# Patient Record
Sex: Male | Born: 1950 | Race: White | Hispanic: No | Marital: Married | Smoking: Never smoker
Health system: Southern US, Community
[De-identification: ages and names within clinical notes are randomized; demographics above are authoritative.]

## PROBLEM LIST (undated history)

## (undated) DIAGNOSIS — E785 Hyperlipidemia, unspecified: Secondary | ICD-10-CM

## (undated) HISTORY — PX: INGUINAL HERNIA REPAIR: SUR1180

---

## 2006-04-26 ENCOUNTER — Ambulatory Visit (HOSPITAL_COMMUNITY): Admission: RE | Admit: 2006-04-26 | Discharge: 2006-04-26 | Payer: Self-pay | Admitting: Internal Medicine

## 2006-04-26 ENCOUNTER — Encounter (INDEPENDENT_AMBULATORY_CARE_PROVIDER_SITE_OTHER): Payer: Self-pay | Admitting: Specialist

## 2006-04-26 ENCOUNTER — Ambulatory Visit: Payer: Self-pay | Admitting: Internal Medicine

## 2006-04-26 HISTORY — PX: COLONOSCOPY: SHX174

## 2009-04-13 ENCOUNTER — Ambulatory Visit (HOSPITAL_BASED_OUTPATIENT_CLINIC_OR_DEPARTMENT_OTHER): Admission: RE | Admit: 2009-04-13 | Discharge: 2009-04-13 | Payer: Self-pay | Admitting: Urology

## 2009-12-26 ENCOUNTER — Emergency Department (HOSPITAL_COMMUNITY): Admission: EM | Admit: 2009-12-26 | Discharge: 2009-12-26 | Payer: Self-pay | Admitting: Emergency Medicine

## 2010-07-19 LAB — POCT HEMOGLOBIN-HEMACUE: Hemoglobin: 15.5 g/dL (ref 13.0–17.0)

## 2010-09-03 NOTE — Op Note (Signed)
[name], [name]               ACCOUNT NO.:  [id number]   MEDICAL RECORD NO.:  [id number]          PATIENT TYPE:  AMB   LOCATION:  DAY                           FACILITY:  [facility]   PHYSICIAN:  R. Roetta Sessions, M.D. DATE OF BIRTH:  16-Jul-1950   DATE OF PROCEDURE:  04/26/2006  DATE OF DISCHARGE:                               OPERATIVE REPORT   Colonoscopy with biopsy.   INDICATIONS FOR PROCEDURE:  The patient is a 60 year old gentleman with  positive family history of colon cancer in his father and brother with  colon polyps.  Last colonoscopy was some 8 years ago.  He has no lower  GI tract symptoms.  Colonoscopy is now being done as a screening  maneuver.  This approach has discussed with the patient at length.  Potential risks, benefits and alternatives have been reviewed, questions  answered.  Please see documentation in the medical record.   PROCEDURE NOTE:  O2 saturation, blood pressure, pulse and respirations  were monitored throughout the entire procedure.  Conscious sedation with  Demerol 75 mg IV, Versed 4 mg IV.   INSTRUMENT:  Pentax video chip system.   FINDINGS:  Digital rectal exam revealed no abnormalities.   ENDOSCOPIC FINDINGS:  Prep was fair.   Colon:  Colonic mucosa was surveyed from the rectosigmoid junction  through the left, transverse and right colon to the appendiceal orifice,  ileocecal valve and cecum.  These structures were well seen and  photographed for the record.  From this level, scope was withdrawn.  All  previously mentioned mucosal surfaces were again seen.  The patient had  scattered left-sided diverticula.  The remainder of the colonic mucosa  appeared normal.  Scope was pulled down into the rectum where a thorough  examination of the rectal mucosa including retroflexed view of the anal  verge was undertaken.  The patient had 2 diminutive polyps just inside  the anal verge that were cold biopsied/removed.  The patient also had  some anal  papilla.  Otherwise, rectal mucosa appeared normal.  The  patient tolerated the procedure well and was reactive to endoscopy.   IMPRESSION:  Anal papilla and 2 diminutive rectal polyps status post  cold biopsy removal.  Otherwise normal rectum.  Left-sided diverticula.  The remainder of the colonic mucosa appeared normal.   RECOMMENDATIONS:  1. Diverticulosis literature provided to [name].  2. Follow-up on path.  3. No aspirin for 10 days.  4. Further recommendations to follow.      Chris Ramirez, M.D.  Electronically Signed     RMR/MEDQ  D:  04/26/2006  T:  04/26/2006  Job:  [id number]   cc:   R. Roetta Sessions, M.D.  [address]  [place]  [place] [address]

## 2011-04-05 ENCOUNTER — Encounter: Payer: Self-pay | Admitting: Internal Medicine

## 2011-04-14 ENCOUNTER — Encounter: Payer: Self-pay | Admitting: Urgent Care

## 2011-04-21 ENCOUNTER — Ambulatory Visit: Payer: Self-pay | Admitting: Urgent Care

## 2011-04-28 ENCOUNTER — Ambulatory Visit: Payer: Self-pay | Admitting: Urgent Care

## 2011-06-16 ENCOUNTER — Encounter: Payer: Self-pay | Admitting: Internal Medicine

## 2011-06-17 ENCOUNTER — Ambulatory Visit: Payer: Self-pay | Admitting: Gastroenterology

## 2012-05-29 ENCOUNTER — Ambulatory Visit: Payer: Self-pay | Admitting: Urgent Care

## 2012-06-25 ENCOUNTER — Ambulatory Visit (INDEPENDENT_AMBULATORY_CARE_PROVIDER_SITE_OTHER): Payer: 59 | Admitting: Gastroenterology

## 2012-06-25 ENCOUNTER — Encounter: Payer: Self-pay | Admitting: Gastroenterology

## 2012-06-25 ENCOUNTER — Other Ambulatory Visit: Payer: Self-pay | Admitting: Internal Medicine

## 2012-06-25 VITALS — BP 136/83 | HR 85 | Temp 98.2°F | Ht 70.0 in | Wt 222.0 lb

## 2012-06-25 DIAGNOSIS — Z800 Family history of malignant neoplasm of digestive organs: Secondary | ICD-10-CM | POA: Insufficient documentation

## 2012-06-25 MED ORDER — PEG 3350-KCL-NA BICARB-NACL 420 G PO SOLR: 4000.0000 mL | ORAL | Status: DC

## 2012-06-25 NOTE — Progress Notes (Signed)
Faxed to PCP

## 2012-06-25 NOTE — Patient Instructions (Addendum)
You are scheduled for a colonoscopy with Dr. Jena Gauss in the near future.  Have a great week!

## 2012-06-25 NOTE — Progress Notes (Signed)
Primary Care Physician:  Chris Obey, MD Primary Gastroenterologist:  Dr. Jena Gauss   Chief Complaint  Patient presents with  . Follow-up    HPI:   62 year old pleasant male presents today for an updated colonoscopy. He has a family history of colon cancer in his father, diagnosed in his 25s. Chris Ramirez last colonoscopy was in 2008 with hyperplastic polyps noted. States eats healthy during the week and unhealthy on the weekends. Mondays are usually a "clean out day", with several bowel movements. No rectal bleeding. Rare reflux related to green peppers. Notes once a month having the sensation of food "not wanting to go down". Points to throat. Thinks he is "diving in" too fast. Doesn't feel like this is an issue, not interested in pursuing further. Notes as rare, doesn't consider a problem.    Past Medical History  Diagnosis Date  . Hypercholesterolemia     resolved with weight loss    Past Surgical History  Procedure Laterality Date  . Colonoscopy  04/26/06    anal paplla and 2 diminutive rectal polyps(Hyperplastic) other wise normal  . Inguinal hernia repair      No current outpatient prescriptions on file.   No current facility-administered medications for this visit.    Allergies as of 06/25/2012  . (No Known Allergies)    Family History  Problem Relation Age of Onset  . Colon cancer Father     in his 31s    History   Social History  . Marital Status: Married    Spouse Name: N/A    Number of Children: N/A  . Years of Education: N/A   Occupational History  . Not on file.   Social History Main Topics  . Smoking status: Never Smoker   . Smokeless tobacco: Not on file  . Alcohol Use: No  . Drug Use: No  . Sexually Active: Not on file   Other Topics Concern  . Not on file   Social History Narrative  . No narrative on file    Review of Systems: Gen: Denies any fever, chills, fatigue, weight loss, lack of appetite.  CV: Denies chest pain, heart  palpitations, peripheral edema, syncope.  Resp: Denies shortness of breath at rest or with exertion. Denies wheezing or cough.  GI: Denies dysphagia or odynophagia. Denies jaundice, hematemesis, fecal incontinence. GU : Denies urinary burning, urinary frequency, urinary hesitancy MS: Denies joint pain, muscle weakness, cramps, or limitation of movement.  Derm: Denies rash, itching, dry skin Psych: Denies depression, anxiety, memory loss, and confusion Heme: Denies bruising, bleeding, and enlarged lymph nodes.  Physical Exam: BP 136/83  Pulse 85  Temp(Src) 98.2 F (36.8 C) (Oral)  Ht 5\' 10"  (1.778 m)  Wt 222 lb (100.699 kg)  BMI 31.85 kg/m2 General:   Alert and oriented. Pleasant and cooperative. Well-nourished and well-developed.  Head:  Normocephalic and atraumatic. Eyes:  Without icterus, sclera clear and conjunctiva pink.  Ears:  Normal auditory acuity. Nose:  No deformity, discharge,  or lesions. Mouth:  No deformity or lesions, oral mucosa pink.  Neck:  Supple, without mass or thyromegaly. Lungs:  Clear to auscultation bilaterally. No wheezes, rales, or rhonchi. No distress.  Heart:  S1, S2 present without murmurs appreciated.  Abdomen:  +BS, soft, non-tender and non-distended. No HSM noted. No guarding or rebound. No masses appreciated.  Rectal:  Deferred  Msk:  Symmetrical without gross deformities. Normal posture. Extremities:  Without clubbing or edema. Neurologic:  Alert and  oriented x4;  grossly normal  neurologically. Skin:  Intact without significant lesions or rashes. Cervical Nodes:  No significant cervical adenopathy. Psych:  Alert and cooperative. Normal mood and affect.

## 2012-06-25 NOTE — Assessment & Plan Note (Signed)
62 year old male with need for updated colonoscopy. Last lower GI evaluation in 2008 with hyperplastic polyps by Dr. Jena Gauss. He has a family history of colon cancer, with his father diagnosed in his 25s. No lower GI symptoms currently. He does note rare incidences of oropharyngeal dysphagia, and he attributes this to eating too quickly or too large of a bite. He is not interested in pursuing this further, as he feels it is more behavior-related. No other concerning symptoms at this time.   Proceed with TCS with Dr. Jena Gauss in near future: the risks, benefits, and alternatives have been discussed with the patient in detail. The patient states understanding and desires to proceed.

## 2012-07-05 ENCOUNTER — Encounter (HOSPITAL_COMMUNITY): Payer: Self-pay | Admitting: Pharmacy Technician

## 2012-07-13 MED ORDER — SODIUM CHLORIDE 0.45 % IV SOLN: INTRAVENOUS | Status: DC

## 2012-07-16 ENCOUNTER — Encounter (HOSPITAL_COMMUNITY): Payer: Self-pay | Admitting: *Deleted

## 2012-07-16 ENCOUNTER — Ambulatory Visit (HOSPITAL_COMMUNITY)
Admission: RE | Admit: 2012-07-16 | Discharge: 2012-07-16 | Disposition: A | Discharge status: Home / Self Care | Payer: 59 | Source: Ambulatory Visit | Attending: Internal Medicine | Admitting: Internal Medicine

## 2012-07-16 ENCOUNTER — Encounter (HOSPITAL_COMMUNITY)
Admission: RE | Disposition: A | Discharge status: Home / Self Care | Payer: Self-pay | Source: Ambulatory Visit | Attending: Internal Medicine

## 2012-07-16 DIAGNOSIS — K5730 Diverticulosis of large intestine without perforation or abscess without bleeding: Secondary | ICD-10-CM | POA: Insufficient documentation

## 2012-07-16 DIAGNOSIS — Z1211 Encounter for screening for malignant neoplasm of colon: Secondary | ICD-10-CM | POA: Insufficient documentation

## 2012-07-16 DIAGNOSIS — Z800 Family history of malignant neoplasm of digestive organs: Secondary | ICD-10-CM | POA: Insufficient documentation

## 2012-07-16 HISTORY — PX: COLONOSCOPY: SHX5424

## 2012-07-16 SURGERY — COLONOSCOPY
Anesthesia: Moderate Sedation

## 2012-07-16 MED ORDER — MEPERIDINE HCL 100 MG/ML IJ SOLN
INTRAMUSCULAR | Status: AC
  Filled 2012-07-16: qty 1

## 2012-07-16 MED ORDER — SODIUM CHLORIDE 0.9 % IV SOLN
INTRAVENOUS | Status: DC
  Administered 2012-07-16: 10:00:00 via INTRAVENOUS

## 2012-07-16 MED ORDER — MEPERIDINE HCL 100 MG/ML IJ SOLN
INTRAMUSCULAR | Status: DC | PRN
  Administered 2012-07-16: 50 mg via INTRAVENOUS
  Administered 2012-07-16: 25 mg via INTRAVENOUS

## 2012-07-16 MED ORDER — STERILE WATER FOR IRRIGATION IR SOLN
Status: DC | PRN
  Administered 2012-07-16: 10:00:00

## 2012-07-16 MED ORDER — MIDAZOLAM HCL 5 MG/5ML IJ SOLN
INTRAMUSCULAR | Status: AC
  Filled 2012-07-16: qty 10

## 2012-07-16 MED ORDER — ONDANSETRON HCL 4 MG/2ML IJ SOLN
INTRAMUSCULAR | Status: DC | PRN
  Administered 2012-07-16: 4 mg via INTRAVENOUS

## 2012-07-16 MED ORDER — ONDANSETRON HCL 4 MG/2ML IJ SOLN
INTRAMUSCULAR | Status: AC
  Filled 2012-07-16: qty 2

## 2012-07-16 MED ORDER — MIDAZOLAM HCL 5 MG/5ML IJ SOLN
INTRAMUSCULAR | Status: DC | PRN
  Administered 2012-07-16 (×2): 2 mg via INTRAVENOUS
  Administered 2012-07-16: 1 mg via INTRAVENOUS

## 2012-07-16 NOTE — Op Note (Signed)
[facility] [address] [place], [address]   COLONOSCOPY PROCEDURE REPORT  PATIENT: Chris Ramirez, Chris Ramirez  MR#:         [id number] BIRTHDATE: 12/22/1950 , 62  yrs. old GENDER: Male ENDOSCOPIST: R.  Roetta Sessions, MD FACP FACG REFERRED BY:  Gareth Morgan, M.D. PROCEDURE DATE:  07/16/2012 PROCEDURE:     high-risk screening ileocolonoscopy  INDICATIONS: Positive family history of colon cancer in first-degree relative  INFORMED CONSENT:  The risks, benefits, alternatives and imponderables including but not limited to bleeding, perforation as well as the possibility of a missed lesion have been reviewed.  The potential for biopsy, lesion removal, etc. have also been discussed.  Questions have been answered.  All parties agreeable. Please see the history and physical in the medical record for more information.  MEDICATIONS: Versed 5 mg IV and Demerol 75 mg IV in divided doses. Zofran 4 mg IV  DESCRIPTION OF PROCEDURE:  After a digital rectal exam was performed, the EC-3890Li ([id number])  colonoscope was advanced from the anus through the rectum and colon to the area of the cecum, ileocecal valve and appendiceal orifice.  The cecum was deeply intubated.  These structures were well-seen and photographed for the record.  From the level of the cecum and ileocecal valve, the scope was slowly and cautiously withdrawn.  The mucosal surfaces were carefully surveyed utilizing scope tip deflection to facilitate fold flattening as needed.  The scope was pulled down into the rectum where a thorough examination including retroflexion was performed.    FINDINGS:  Adequate preparation. Normal rectum. Scattered left-sided diverticula; the remainder of the colonic mucosa appeared normal. The distal 10 cm of terminal ileal mucosa appeared normal.  THERAPEUTIC / DIAGNOSTIC MANEUVERS PERFORMED:  None  COMPLICATIONS: None  CECAL WITHDRAWAL TIME:  18 minutes  IMPRESSION:  Colonic  diverticulosis  RECOMMENDATIONS: Repeat screening colonoscopy in 5 years.   _______________________________ eSigned:  R. Roetta Sessions, MD FACP [facility] 07/16/2012 11:09 AM   CC:    PATIENT NAME:  Chris Ramirez, Chris Ramirez MR#: [id number]

## 2012-07-16 NOTE — H&P (View-Only) (Signed)
Primary Care Physician:  KNOWLTON,STEPHEN D, MD Primary Gastroenterologist:  Dr. Rourk   Chief Complaint  Patient presents with  . Follow-up    HPI:   62-year-old pleasant male presents today for an updated colonoscopy. He has a family history of colon cancer in his father, diagnosed in his 70s. Mr. Morlock's last colonoscopy was in 2008 with hyperplastic polyps noted. States eats healthy during the week and unhealthy on the weekends. Mondays are usually a "clean out day", with several bowel movements. No rectal bleeding. Rare reflux related to green peppers. Notes once a month having the sensation of food "not wanting to go down". Points to throat. Thinks he is "diving in" too fast. Doesn't feel like this is an issue, not interested in pursuing further. Notes as rare, doesn't consider a problem.    Past Medical History  Diagnosis Date  . Hypercholesterolemia     resolved with weight loss    Past Surgical History  Procedure Laterality Date  . Colonoscopy  04/26/06    anal paplla and 2 diminutive rectal polyps(Hyperplastic) other wise normal  . Inguinal hernia repair      No current outpatient prescriptions on file.   No current facility-administered medications for this visit.    Allergies as of 06/25/2012  . (No Known Allergies)    Family History  Problem Relation Age of Onset  . Colon cancer Father     in his 70s    History   Social History  . Marital Status: Married    Spouse Name: N/A    Number of Children: N/A  . Years of Education: N/A   Occupational History  . Not on file.   Social History Main Topics  . Smoking status: Never Smoker   . Smokeless tobacco: Not on file  . Alcohol Use: No  . Drug Use: No  . Sexually Active: Not on file   Other Topics Concern  . Not on file   Social History Narrative  . No narrative on file    Review of Systems: Gen: Denies any fever, chills, fatigue, weight loss, lack of appetite.  CV: Denies chest pain, heart  palpitations, peripheral edema, syncope.  Resp: Denies shortness of breath at rest or with exertion. Denies wheezing or cough.  GI: Denies dysphagia or odynophagia. Denies jaundice, hematemesis, fecal incontinence. GU : Denies urinary burning, urinary frequency, urinary hesitancy MS: Denies joint pain, muscle weakness, cramps, or limitation of movement.  Derm: Denies rash, itching, dry skin Psych: Denies depression, anxiety, memory loss, and confusion Heme: Denies bruising, bleeding, and enlarged lymph nodes.  Physical Exam: BP 136/83  Pulse 85  Temp(Src) 98.2 F (36.8 C) (Oral)  Ht 5' 10" (1.778 m)  Wt 222 lb (100.699 kg)  BMI 31.85 kg/m2 General:   Alert and oriented. Pleasant and cooperative. Well-nourished and well-developed.  Head:  Normocephalic and atraumatic. Eyes:  Without icterus, sclera clear and conjunctiva pink.  Ears:  Normal auditory acuity. Nose:  No deformity, discharge,  or lesions. Mouth:  No deformity or lesions, oral mucosa pink.  Neck:  Supple, without mass or thyromegaly. Lungs:  Clear to auscultation bilaterally. No wheezes, rales, or rhonchi. No distress.  Heart:  S1, S2 present without murmurs appreciated.  Abdomen:  +BS, soft, non-tender and non-distended. No HSM noted. No guarding or rebound. No masses appreciated.  Rectal:  Deferred  Msk:  Symmetrical without gross deformities. Normal posture. Extremities:  Without clubbing or edema. Neurologic:  Alert and  oriented x4;  grossly normal   neurologically. Skin:  Intact without significant lesions or rashes. Cervical Nodes:  No significant cervical adenopathy. Psych:  Alert and cooperative. Normal mood and affect.    

## 2012-07-16 NOTE — Interval H&P Note (Signed)
History and Physical Interval Note:  07/16/2012 10:22 AM  Chris Ramirez  has presented today for surgery, with the diagnosis of SCREENING COLONOSCOPY  The various methods of treatment have been discussed with the patient and family. After consideration of risks, benefits and other options for treatment, the patient has consented to  Procedure(s) with comments: COLONOSCOPY (N/A) - 8:30-moved to 10:30 Chris Ramirez to notify pt as a surgical intervention .  The patient's history has been reviewed, patient examined, no change in status, stable for surgery.  I have reviewed the patient's chart and labs.  Questions were answered to the patient's satisfaction.     Eula Listen  High-risk screening colonoscopy per plan.The risks, benefits, limitations, alternatives and imponderables have been reviewed with the patient. Questions have been answered. All parties are agreeable.

## 2012-07-18 ENCOUNTER — Encounter (HOSPITAL_COMMUNITY): Payer: Self-pay | Admitting: Internal Medicine

## 2012-09-16 ENCOUNTER — Emergency Department (HOSPITAL_COMMUNITY): Payer: 59

## 2012-09-16 ENCOUNTER — Observation Stay (HOSPITAL_COMMUNITY)
Admission: EM | Admit: 2012-09-16 | Discharge: 2012-09-17 | Disposition: A | Discharge status: Home / Self Care | Payer: 59 | Attending: Family Medicine | Admitting: Family Medicine

## 2012-09-16 ENCOUNTER — Encounter (HOSPITAL_COMMUNITY): Payer: Self-pay | Admitting: Emergency Medicine

## 2012-09-16 DIAGNOSIS — R55 Syncope and collapse: Principal | ICD-10-CM | POA: Insufficient documentation

## 2012-09-16 HISTORY — DX: Hyperlipidemia, unspecified: E78.5

## 2012-09-16 LAB — COMPREHENSIVE METABOLIC PANEL
ALT: 20 U/L (ref 0–53)
AST: 21 U/L (ref 0–37)
CO2: 26 mEq/L (ref 19–32)
Chloride: 106 mEq/L (ref 96–112)
Creatinine, Ser: 1.09 mg/dL (ref 0.50–1.35)
GFR calc non Af Amer: 71 mL/min — ABNORMAL LOW (ref >90)
Total Bilirubin: 0.7 mg/dL (ref 0.3–1.2)

## 2012-09-16 LAB — CBC WITH DIFFERENTIAL/PLATELET
Basophils Absolute: 0 10*3/uL (ref 0.0–0.1)
HCT: 45.3 % (ref 39.0–52.0)
Hemoglobin: 15.9 g/dL (ref 13.0–17.0)
Lymphocytes Relative: 36 % (ref 12–46)
Monocytes Absolute: 0.4 10*3/uL (ref 0.1–1.0)
Neutro Abs: 2.5 10*3/uL (ref 1.7–7.7)
RBC: 5.15 MIL/uL (ref 4.22–5.81)
RDW: 12.6 % (ref 11.5–15.5)
WBC: 4.7 10*3/uL (ref 4.0–10.5)

## 2012-09-16 LAB — URINALYSIS, ROUTINE W REFLEX MICROSCOPIC
Glucose, UA: NEGATIVE mg/dL
Hgb urine dipstick: NEGATIVE
Ketones, ur: NEGATIVE mg/dL
Protein, ur: NEGATIVE mg/dL

## 2012-09-16 LAB — GLUCOSE, CAPILLARY
Glucose-Capillary: 117 mg/dL — ABNORMAL HIGH (ref 70–99)
Glucose-Capillary: 126 mg/dL — ABNORMAL HIGH (ref 70–99)

## 2012-09-16 MED ORDER — ENOXAPARIN SODIUM 40 MG/0.4ML ~~LOC~~ SOLN: 40.0000 mg | SUBCUTANEOUS | Status: DC

## 2012-09-16 MED ORDER — ACETAMINOPHEN 325 MG PO TABS: 650.0000 mg | ORAL_TABLET | Freq: Four times a day (QID) | ORAL | Status: DC | PRN

## 2012-09-16 MED ORDER — ONDANSETRON HCL 4 MG PO TABS: 4.0000 mg | ORAL_TABLET | Freq: Four times a day (QID) | ORAL | Status: DC | PRN

## 2012-09-16 MED ORDER — ONDANSETRON HCL 4 MG/2ML IJ SOLN: 4.0000 mg | Freq: Four times a day (QID) | INTRAMUSCULAR | Status: DC | PRN

## 2012-09-16 MED ORDER — ACETAMINOPHEN 650 MG RE SUPP: 650.0000 mg | Freq: Four times a day (QID) | RECTAL | Status: DC | PRN

## 2012-09-16 MED ORDER — POTASSIUM CHLORIDE IN NACL 20-0.9 MEQ/L-% IV SOLN
INTRAVENOUS | Status: DC
  Administered 2012-09-16 – 2012-09-17 (×2): via INTRAVENOUS

## 2012-09-16 MED ORDER — SODIUM CHLORIDE 0.9 % IJ SOLN
3.0000 mL | Freq: Two times a day (BID) | INTRAMUSCULAR | Status: DC
  Administered 2012-09-17: 3 mL via INTRAVENOUS

## 2012-09-16 NOTE — Progress Notes (Signed)
[facility] History and Physical  Chris Ramirez ZOX:[id number] DOB: 1950-05-26 DOA: 09/16/2012  Referring physician: Dr. Clarene Duke PCP: Milana Obey, MD  Specialists:   Chief Complaint: Syncope  HPI: Chris Ramirez is a 62 y.o. male no significant medical problems who presents to the emergency room after a syncopal episode. Patient was at a church function today and had been standing for a long period of time. He reports feeling dizzy, lightheaded and then "everything went dark". The patient then woke up on the floor and did not remember falling to the floor. He was told he was unconscious for a few seconds. He reports that he may have bumped his head on a table while falling to the ground. Denies any chest pain, shortness of breath, nausea vomiting, diaphoresis. He's not had any recent fever, cough, upper respiratory tract infection symptoms. His appetite has been adequate. He is otherwise feeling quite well outside of this one episode. Since he gained consciousness, he feels back to his usual self. Patient was evaluated in the emergency room and referred for observation.  Review of Systems: Pertinent positives as per history of present illness, otherwise negative  Past Medical History  Diagnosis Date  . Medical history non-contributory    Past Surgical History  Procedure Laterality Date  . Colonoscopy  04/26/06    anal paplla and 2 diminutive rectal polyps(Hyperplastic) other wise normal  . Inguinal hernia repair    . Colonoscopy N/A 07/16/2012    Procedure: COLONOSCOPY;  Surgeon: Corbin Ade, MD;  Location: [facility];  Service: Endoscopy;  Laterality: N/A;  8:30-moved to 10:30 [name] to notify pt   Social History:  reports that he has never smoked. He does not have any smokeless tobacco history on file. He reports that he drinks about 0.6 ounces of alcohol per week. He reports that he does not use illicit drugs.   Allergies  Allergen Reactions  . Ibuprofen  Anaphylaxis, Itching and Rash  . Penicillins Itching    Family History  Problem Relation Age of Onset  . Colon cancer Father     in his 3s  . Heart failure Mother     Prior to Admission medications   Medication Sig Start Date End Date Taking? Authorizing Provider  Carboxymethylcellulose Sodium (THERATEARS) 0.25 % SOLN Apply 1 drop to eye daily as needed.   Yes Historical Provider, MD   Physical Exam: Filed Vitals:   09/16/12 1418 09/16/12 1421 09/16/12 1529 09/16/12 1805  BP: 133/80 120/77 120/78 123/84  Pulse: 73 78 75 68  Temp:    98.1 F (36.7 C)  TempSrc:    Oral  Resp:   18 20  Height:      Weight:      SpO2:   94% 96%     General:  No acute distress  Eyes: Pupils are equal round react to light  ENT: Mucous membranes are moist  Neck: Supple  Cardiovascular: S1, S2, regular rate and rhythm  Respiratory: Clear to auscultation bilaterally  Abdomen: Soft, nontender, nondistended, bowel sounds are active  Skin: No rashes  Musculoskeletal: No pedal edema bilaterally  Psychiatric: Normal affect, cooperative with exam  Neurologic: Grossly intact, nonfocal  Labs on Admission:  Basic Metabolic Panel:  Recent Labs Lab 09/16/12 1347  NA 140  K 3.7  CL 106  CO2 26  GLUCOSE 125*  BUN 13  CREATININE 1.09  CALCIUM 9.0   Liver Function Tests:  Recent Labs Lab 09/16/12 1347  AST 21  ALT 20  ALKPHOS 54  BILITOT 0.7  PROT 6.4  ALBUMIN 3.7   No results found for this basename: LIPASE, AMYLASE,  in the last 168 hours No results found for this basename: AMMONIA,  in the last 168 hours CBC:  Recent Labs Lab 09/16/12 1347  WBC 4.7  NEUTROABS 2.5  HGB 15.9  HCT 45.3  MCV 88.0  PLT 133*   Cardiac Enzymes:  Recent Labs Lab 09/16/12 1347  TROPONINI <0.30    BNP (last 3 results) No results found for this basename: PROBNP,  in the last 8760 hours CBG:  Recent Labs Lab 09/16/12 1420 09/16/12 1425  GLUCAP 117* 126*    Radiological  Exams on Admission: Dg Chest 2 View  09/16/2012   *RADIOLOGY REPORT*  Clinical Data: Loss of consciousness.  Syncope.  CHEST - 2 VIEW  Comparison: None.  Findings: Lateral view degraded by patient arm position.  Costophrenic angles excluded from frontal film. Midline trachea. Normal heart size and mediastinal contours. No pleural effusion or pneumothorax.  A well circumscribed nodular density projecting over the left mid lung measures 9 mm.  IMPRESSION: No acute cardiopulmonary disease.  9 mm well-circumscribed density projecting over the left midlung is either a nipple shadow or calcified granuloma.   Original Report Authenticated By: Jeronimo Greaves, M.D.   Ct Head Wo Contrast  09/16/2012   *RADIOLOGY REPORT*  Clinical Data: Syncopal episode.  CT HEAD WITHOUT CONTRAST  Technique:  Contiguous axial images were obtained from the base of the skull through the vertex without contrast.  Comparison: None.  Findings: No acute intracranial abnormality is present. Specifically, there is no evidence for acute infarct, hemorrhage, mass, hydrocephalus, or extra-axial fluid collection.  The paranasal sinuses and mastoid air cells are clear.  The globes and orbits are intact.  The osseous skull is intact.  IMPRESSION: Negative CT of the head.   Original Report Authenticated By: Marin Roberts, M.D.   Dg Knee Complete 4 Views Right  09/16/2012   *RADIOLOGY REPORT*  Clinical Data: Syncopal episode with knee pain.  Fall.  RIGHT KNEE - COMPLETE 4+ VIEW  Comparison: None.  Findings: No acute fracture or dislocation.  There is artifact projecting over the suprapatellar bursa on the lateral view.  No convincing evidence of joint effusion.  Minimal enthesopathic change at the quadriceps insertion.  IMPRESSION: No acute osseous abnormality.   Original Report Authenticated By: Jeronimo Greaves, M.D.    EKG: Independently reviewed. Normal EKG  Assessment/Plan Active Problems:   Syncope   1. Syncope and collapse. Possibly  vasovagal in origin. Patient was monitored overnight on telemetry. We will check 2-D echocardiogram as well as carotid Dopplers. Cardiac markers were cycled and d-dimer will be checked. We will provide gentle hydration and check orthostatics. Check TSH. If workup is negative, he can likely discharge home tomorrow an outpatient cardiology referral may be considered. The patient has a allergy to ibuprofen listed as anaphylaxis. We'll hold off on aspirin for now.  Patient will be admitted to the service of Dr. Sudie Bailey. [facility] will be available for any issues until 7 AM on 09/17/12  Code Status: Full code Family Communication: Discussed with patient Disposition Plan: Probable discharge home in the morning if workup is negative  Time spent:66mins  Chris Ramirez [facility] Pager [phone]  If 7PM-7AM, please contact night-coverage [email or web address] Password [facility] 09/16/2012, 7:12 PM

## 2012-09-16 NOTE — ED Notes (Signed)
States that he ate a large lunch, states that he passed out and woke up on the floor.  States that he landed face first apparently because he injured his lip and right knee

## 2012-09-16 NOTE — ED Provider Notes (Signed)
History     CSN: [id number]  Arrival date & time 09/16/12  1331   First MD Initiated Contact with Patient 09/16/12 1346      Chief Complaint  Patient presents with  . Loss of Consciousness    HPI Pt was seen at 1425.  Per pt and family, c/o sudden onset and resolution of one episode of brief syncope that occurred PTA. Pt states he was standing talking to several people at a church function when he "felt the room getting dark" and "next thing I remember I woke up on the floor."  Pt states he might have hit his upper lip on a table and his right knee against the concrete floor. No seizure activity, no incont of bowel/bladder, no confusion upon awakening.  Denies prodromal symptoms, no CP/palpitations, no SOB/cough, no abd pain, no N/V/D, no back or neck pain, no focal motor weakness, no tingling/numbness in extremities.   Past Medical History  Diagnosis Date  . Medical history non-contributory     Past Surgical History  Procedure Laterality Date  . Colonoscopy  04/26/06    anal paplla and 2 diminutive rectal polyps(Hyperplastic) other wise normal  . Inguinal hernia repair    . Colonoscopy N/A 07/16/2012    Procedure: COLONOSCOPY;  Surgeon: Corbin Ade, MD;  Location: [facility];  Service: Endoscopy;  Laterality: N/A;  8:30-moved to 10:30 [name] to notify pt    Family History  Problem Relation Age of Onset  . Colon cancer Father     in his 32s  . Heart failure Mother     History  Substance Use Topics  . Smoking status: Never Smoker   . Smokeless tobacco: Not on file  . Alcohol Use: 0.6 oz/week    1 Glasses of wine per week      Review of Systems ROS: Statement: All systems negative except as marked or noted in the HPI; Constitutional: Negative for fever and chills. ; ; Eyes: Negative for eye pain, redness and discharge. ; ; ENMT: Negative for ear pain, hoarseness, nasal congestion, sinus pressure and sore throat. ; ; Cardiovascular: Negative for chest pain,  palpitations, diaphoresis, dyspnea and peripheral edema. ; ; Respiratory: Negative for cough, wheezing and stridor. ; ; Gastrointestinal: Negative for nausea, vomiting, diarrhea, abdominal pain, blood in stool, hematemesis, jaundice and rectal bleeding. . ; ; Genitourinary: Negative for dysuria, flank pain and hematuria. ; ; Musculoskeletal: Negative for back pain and neck pain. Negative for swelling and trauma.; ; Skin: Negative for pruritus, rash, abrasions, blisters, bruising and skin lesion.; ; Neuro: Negative for headache, lightheadedness and neck stiffness. Negative for weakness, altered level of consciousness , altered mental status, extremity weakness, paresthesias, involuntary movement, seizure and +syncope.       Allergies  Ibuprofen and Penicillins  Home Medications   Current Outpatient Rx  Name  Route  Sig  Dispense  Refill  . hydrocortisone cream 0.5 %   Topical   Apply 1 application topically daily as needed (Dry Skin).         . polyethylene glycol-electrolytes (TRILYTE) 420 G solution   Oral   Take 4,000 mLs by mouth as directed.   4000 mL   0     BP 120/78  Pulse 75  Temp(Src) 97.7 F (36.5 C) (Oral)  Resp 18  Ht 5\' 10"  (1.778 m)  Wt 206 lb (93.441 kg)  BMI 29.56 kg/m2  SpO2 94%  Physical Exam 1430: Physical examination:  Nursing notes reviewed; Vital  signs and O2 SAT reviewed;  Constitutional: Well developed, Well nourished, Well hydrated, In no acute distress; Head:  Normocephalic, atraumatic. +left upper lateral lip with mild localized edema. No abrasions, no ecchymosis, no open wounds.; Eyes: EOMI, PERRL, No scleral icterus; ENMT: TM's clear bilat. No septal hematoma, no epistaxis. No intra-oral injury/bleeding. Mouth and pharynx normal, Mucous membranes moist; Neck: Supple, Full range of motion, No lymphadenopathy; Cardiovascular: Regular rate and rhythm, No murmur, rub, or gallop; Respiratory: Breath sounds clear & equal bilaterally, No rales, rhonchi,  wheezes.  Speaking full sentences with ease, Normal respiratory effort/excursion; Chest: Nontender, Movement normal; Abdomen: Soft, Nontender, Nondistended, Normal bowel sounds; Genitourinary: No CVA tenderness; Spine:  No midline CS, TS, LS tenderness.;; Extremities: Pulses normal, +FROM right knee, including able to lift extended RLE off stretcher, and extend right lower leg against resistance.  No ligamentous laxity.  No patellar or quad tendon step-offs.  NMS intact right foot, strong pedal pp. +plantarflexion of right foot w/calf squeeze.  No palpable gap right Achilles's tendon.  No proximal fibular head tenderness.  No edema, erythema, warmth, ecchymosis or deformity.  No specific area of point tenderness. No deformity, No edema, No calf edema or asymmetry.; Neuro: AA&Ox3, Major CN grossly intact.  Strength 5/5 equal bilat UE's and LE's.  DTR 2/4 equal bilat UE's and LE's.  No gross sensory deficits.  Normal cerebellar testing bilat UE's (finger-nose) and LE's (heel-shin). Speech clear.  No facial droop.  No nystagmus..; Skin: Color normal, Warm, Dry.   ED Course  Procedures     MDM  MDM Reviewed: previous chart, nursing note and vitals Reviewed previous: labs and ECG Interpretation: labs, ECG, x-ray and CT scan    Date: 09/16/2012  Rate: 70  Rhythm: normal sinus rhythm  QRS Axis: normal  Intervals: normal  ST/T Wave abnormalities: normal  Conduction Disutrbances:none  Narrative Interpretation:   Old EKG Reviewed: unchanged; no significant changes from previous EKG dated 04/13/2009.  Results for orders placed during the hospital encounter of 09/16/12  CBC WITH DIFFERENTIAL      Result Value Range   WBC 4.7  4.0 - 10.5 K/uL   RBC 5.15  4.22 - 5.81 MIL/uL   Hemoglobin 15.9  13.0 - 17.0 g/dL   HCT 16.1  09.6 - 04.5 %   MCV 88.0  78.0 - 100.0 fL   MCH 30.9  26.0 - 34.0 pg   MCHC 35.1  30.0 - 36.0 g/dL   RDW 40.9  81.1 - 91.4 %   Platelets 133 (*) 150 - 400 K/uL   Neutrophils  Relative % 53  43 - 77 %   Neutro Abs 2.5  1.7 - 7.7 K/uL   Lymphocytes Relative 36  12 - 46 %   Lymphs Abs 1.7  0.7 - 4.0 K/uL   Monocytes Relative 9  3 - 12 %   Monocytes Absolute 0.4  0.1 - 1.0 K/uL   Eosinophils Relative 2  0 - 5 %   Eosinophils Absolute 0.1  0.0 - 0.7 K/uL   Basophils Relative 1  0 - 1 %   Basophils Absolute 0.0  0.0 - 0.1 K/uL  COMPREHENSIVE METABOLIC PANEL      Result Value Range   Sodium 140  135 - 145 mEq/L   Potassium 3.7  3.5 - 5.1 mEq/L   Chloride 106  96 - 112 mEq/L   CO2 26  19 - 32 mEq/L   Glucose, Bld 125 (*) 70 - 99 mg/dL  BUN 13  6 - 23 mg/dL   Creatinine, Ser 4.54  0.50 - 1.35 mg/dL   Calcium 9.0  8.4 - 09.8 mg/dL   Total Protein 6.4  6.0 - 8.3 g/dL   Albumin 3.7  3.5 - 5.2 g/dL   AST 21  0 - 37 U/L   ALT 20  0 - 53 U/L   Alkaline Phosphatase 54  39 - 117 U/L   Total Bilirubin 0.7  0.3 - 1.2 mg/dL   GFR calc non Af Amer 71 (*) >90 mL/min   GFR calc Af Amer 83 (*) >90 mL/min  TROPONIN I      Result Value Range   Troponin I <0.30  <0.30 ng/mL  URINALYSIS, ROUTINE W REFLEX MICROSCOPIC      Result Value Range   Color, Urine YELLOW  YELLOW   APPearance CLEAR  CLEAR   Specific Gravity, Urine >1.030 (*) 1.005 - 1.030   pH 6.0  5.0 - 8.0   Glucose, UA NEGATIVE  NEGATIVE mg/dL   Hgb urine dipstick NEGATIVE  NEGATIVE   Bilirubin Urine NEGATIVE  NEGATIVE   Ketones, ur NEGATIVE  NEGATIVE mg/dL   Protein, ur NEGATIVE  NEGATIVE mg/dL   Urobilinogen, UA 0.2  0.0 - 1.0 mg/dL   Nitrite NEGATIVE  NEGATIVE   Leukocytes, UA NEGATIVE  NEGATIVE  GLUCOSE, CAPILLARY      Result Value Range   Glucose-Capillary 117 (*) 70 - 99 mg/dL  GLUCOSE, CAPILLARY      Result Value Range   Glucose-Capillary 126 (*) 70 - 99 mg/dL   Comment 1 Notify RN     Dg Chest 2 View 09/16/2012   *RADIOLOGY REPORT*  Clinical Data: Loss of consciousness.  Syncope.  CHEST - 2 VIEW  Comparison: None.  Findings: Lateral view degraded by patient arm position.  Costophrenic angles  excluded from frontal film. Midline trachea. Normal heart size and mediastinal contours. No pleural effusion or pneumothorax.  A well circumscribed nodular density projecting over the left mid lung measures 9 mm.  IMPRESSION: No acute cardiopulmonary disease.  9 mm well-circumscribed density projecting over the left midlung is either a nipple shadow or calcified granuloma.   Original Report Authenticated By: Jeronimo Greaves, M.D.   Ct Head Wo Contrast 09/16/2012   *RADIOLOGY REPORT*  Clinical Data: Syncopal episode.  CT HEAD WITHOUT CONTRAST  Technique:  Contiguous axial images were obtained from the base of the skull through the vertex without contrast.  Comparison: None.  Findings: No acute intracranial abnormality is present. Specifically, there is no evidence for acute infarct, hemorrhage, mass, hydrocephalus, or extra-axial fluid collection.  The paranasal sinuses and mastoid air cells are clear.  The globes and orbits are intact.  The osseous skull is intact.  IMPRESSION: Negative CT of the head.   Original Report Authenticated By: Marin Roberts, M.D.   Dg Knee Complete 4 Views Right 09/16/2012   *RADIOLOGY REPORT*  Clinical Data: Syncopal episode with knee pain.  Fall.  RIGHT KNEE - COMPLETE 4+ VIEW  Comparison: None.  Findings: No acute fracture or dislocation.  There is artifact projecting over the suprapatellar bursa on the lateral view.  No convincing evidence of joint effusion.  Minimal enthesopathic change at the quadriceps insertion.  IMPRESSION: No acute osseous abnormality.   Original Report Authenticated By: Jeronimo Greaves, M.D.    1600:  Not orthostatic. States he "feels ok." No clear cause for syncopal episode today. Dx and testing d/w pt and family.  Questions answered.  Verb understanding, agreeable to observation admit.  T/C to [facility] Dr. Kerry Hough, case discussed, including:  HPI, pertinent PM/SHx, VS/PE, dx testing, ED course and treatment:  Agreeable to observation admit, requests to write  temporary orders, obtain tele bed to Dr. Michelle Nasuti service.            Laray Anger, DO 09/18/12 [phone]

## 2012-09-16 NOTE — ED Notes (Signed)
Pt presents after a syncope episode this afternoon. Pt reports no dizziness before fainting. Pt had eaten large lunch with friends, was standing then remembers laying on ground with people around him trying to wake him. Pt reports has had near episodes recently in the past. Pt denies cardiac pain/history, is alert and oriented x 4. Skin pink warm and dry to touch. Denies abdominal pain, headache. States feels better at this time. NAD noted

## 2012-09-17 ENCOUNTER — Observation Stay (HOSPITAL_COMMUNITY): Payer: 59

## 2012-09-17 ENCOUNTER — Encounter (HOSPITAL_COMMUNITY): Payer: Self-pay | Admitting: Cardiology

## 2012-09-17 DIAGNOSIS — I517 Cardiomegaly: Secondary | ICD-10-CM

## 2012-09-17 DIAGNOSIS — R55 Syncope and collapse: Principal | ICD-10-CM

## 2012-09-17 LAB — COMPREHENSIVE METABOLIC PANEL
ALT: 19 U/L (ref 0–53)
AST: 19 U/L (ref 0–37)
Alkaline Phosphatase: 50 U/L (ref 39–117)
Calcium: 8.7 mg/dL (ref 8.4–10.5)
GFR calc Af Amer: >90 mL/min (ref >90)
Glucose, Bld: 102 mg/dL — ABNORMAL HIGH (ref 70–99)
Potassium: 3.5 mEq/L (ref 3.5–5.1)
Sodium: 138 mEq/L (ref 135–145)
Total Protein: 6 g/dL (ref 6.0–8.3)

## 2012-09-17 LAB — URINE CULTURE
Colony Count: NO GROWTH
Culture: NO GROWTH

## 2012-09-17 LAB — TROPONIN I: Troponin I: <0.3 ng/mL (ref <0.30)

## 2012-09-17 LAB — CBC
HCT: 44.6 % (ref 39.0–52.0)
Hemoglobin: 15.6 g/dL (ref 13.0–17.0)
MCH: 30.9 pg (ref 26.0–34.0)
MCHC: 35 g/dL (ref 30.0–36.0)
MCV: 88.3 fL (ref 78.0–100.0)
RDW: 12.7 % (ref 11.5–15.5)

## 2012-09-17 LAB — TSH: TSH: 1.469 u[IU]/mL (ref 0.350–4.500)

## 2012-09-17 NOTE — Progress Notes (Signed)
*  PRELIMINARY RESULTS* Echocardiogram 2D Echocardiogram has been performed.  [name] Jane 09/17/2012, 2:55 PM 

## 2012-09-17 NOTE — Consult Note (Signed)
[facility]   CARDIOLOGY CONSULT NOTE   Patient ID: Chris Ramirez MRN: [id number] DOB/AGE: Apr 23, 1950 62 y.o.  Admit date: 09/16/2012 Referring Physician: Sudie Bailey MD Primary Fransisca Connors, MD Consulting Cardiologist: Diona Browner, MD Reason for Consultation: Syncope  HPI: Chris Ramirez is a very pleasant 62 year old male patient admitted after a brief syncopal episode. He has no prior cardiac history. Only risk factor is hypercholesterolemia followed by Dr. Sudie Bailey. He is on no medications. He was at a church event, had eaten a large meal and was up walking around the room visiting with other people, then standing still in place for about 5 minutes when he had a feeling of fuzziness and lightheadedness, as if he had stood up suddenly. He also during that time felt that he needed to urinate. He lost consciousness briefly, sinking to the floor, multiple people around noting no seizure activity, bowel incontinence or bladder incontinence. The patient denies feeling any preceding palpitations, chest pain, dyspnea, or diaphoresis prior to the event. 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  On arrival to the emergency room the patient's blood pressure was 120/73, heart rate 70 respirations 16 and O2 sat of 96%. Orthostatics were negative. It was noted on CBC that he was thrombocytopenic with platelets of 133. Blood glucose was 125. Potassium 3.7 with a creatinine of 1.09. Cardiac enzymes found to be negative. EKG revealed normal sinus rhythm with a rate of 78 beats per minute. The patient was placed on IV fluids and bed rest. CT scan of the  head was negative for acute intracranial abnormality.  The patient is feeling well without any further complaints of syncope or dizziness.  Review of systems complete and found to be negative unless listed above   Past Medical History  Diagnosis Date  . Hyperlipidemia     Managed by diet    Family History  Problem Relation Age of Onset  . Colon cancer Father     in his 77s  . Heart failure Mother   . Heart attack Father     History   Social History  . Marital Status: Married    Spouse Name: N/A    Number of Children: N/A  . Years of Education: N/A   Occupational History  . Firefighter   . HVAC    Social History Main Topics  . Smoking status: Never Smoker   . Smokeless tobacco: Not on file  . Alcohol Use: 0.6 oz/week    1 Glasses of wine per week  . Drug Use: No  . Sexually Active: Not on file   Other Topics Concern  . Not on file   Social History Narrative   IT sales professional, Agricultural consultant, former Stage manager.   Heating and Air service    Past Surgical History  Procedure Laterality Date  . Colonoscopy  04/26/06    Anal paplla and 2 diminutive rectal polyps(Hyperplastic) other wise normal  . Inguinal hernia repair    . Colonoscopy N/A 07/16/2012    Procedure: COLONOSCOPY;  Surgeon: Corbin Ade, MD;  Location: [facility];  Service: Endoscopy;  Laterality: N/A;  8:30-moved to 10:30 Chris Ramirez  to notify pt     Prescriptions prior to admission  Medication Sig Dispense Refill  . Carboxymethylcellulose Sodium (THERATEARS) 0.25 % SOLN Apply 1 drop to eye daily as needed.        Physical Exam: Blood pressure 100/60, pulse 62, temperature 97.9 F (36.6 C), temperature source Oral, resp. rate 18, height 5\' 10"  (1.778 m), weight 206 lb (93.441 kg), SpO2 95.00%.  General: Well developed, well nourished, in no acute distress Head: Eyes PERRLA, No xanthomas.   Normal cephalic and atramatic  Lungs: Clear bilaterally to auscultation and percussion. Heart: HRRR S1 S2, without MRG.   Pulses are 2+ & equal.            No carotid bruit. No JVD.  No abdominal bruits. No femoral bruits. Abdomen: Bowel sounds are positive, abdomen soft and non-tender without masses or                  Hernia's noted. Msk:  Back normal, normal gait. Normal strength and tone for age. Extremities: No clubbing, cyanosis or edema.  DP +1 Neuro: Alert and oriented X 3. Psych:  Good affect, responds appropriately  Labs:   Lab Results  Component Value Date   WBC 5.8 09/17/2012   HGB 15.6 09/17/2012   HCT 44.6 09/17/2012   MCV 88.3 09/17/2012   PLT 131* 09/17/2012    Recent Labs Lab 09/17/12 0223  NA 138  K 3.5  CL 105  CO2 27  BUN 13  CREATININE 1.01  CALCIUM 8.7  PROT 6.0  BILITOT 0.6  ALKPHOS 50  ALT 19  AST 19  GLUCOSE 102*   Lab Results  Component Value Date   TROPONINI <0.30 09/17/2012     BNP (last 3 results)  Radiology: Dg Chest 2 View  09/16/2012   *RADIOLOGY REPORT*  Clinical Data: Loss of consciousness.  Syncope.  CHEST - 2 VIEW  Comparison: None.  Findings: Lateral view degraded by patient arm position.  Costophrenic angles excluded from frontal film. Midline trachea. Normal heart size and mediastinal contours. No pleural effusion or pneumothorax.  A well circumscribed nodular density projecting over the left mid lung measures 9 mm.  IMPRESSION: No acute cardiopulmonary disease.  9 mm well-circumscribed density projecting over the left midlung is either a nipple shadow or calcified granuloma.   Original Report Authenticated By: Jeronimo Greaves, M.D.   Ct Head Wo Contrast  09/16/2012   *RADIOLOGY REPORT*  Clinical Data: Syncopal episode.  CT HEAD WITHOUT CONTRAST  Technique:  Contiguous axial images were obtained from the base of the skull through the vertex without contrast.  Comparison: None.  Findings: No acute intracranial abnormality is present. Specifically, there is no evidence for acute infarct, hemorrhage, mass, hydrocephalus, or extra-axial fluid collection.  The paranasal  sinuses and mastoid air cells are clear.  The globes and orbits are intact.  The osseous skull is intact.  IMPRESSION: Negative CT of the head.   Original Report Authenticated By: Marin Roberts, M.D.   US Carotid Duplex Bilateral  09/17/2012   *RADIOLOGY REPORT*  Clinical Data: Syncope  BILATERAL CAROTID DUPLEX ULTRASOUND .  IMPRESSION:  1.  Trace atherosclerotic plaque bilaterally resulting in less than 50% diameter internal carotid artery stenoses.  2.  Mild early systolic deceleration noted in the left vertebral artery waveform.  This finding can be seen with incomplete vertebral artery steal in the setting of left subclavian stenosis. Recommend clinical correlation for signs and symptoms of left upper extremity claudication or vertebrobasilar  insufficiency.  If further imaging is clinically warranted, consider CTA or MRA of the neck.  Signed,  Sterling Big, MD Vascular & Interventional Radiologist [facility]   Original Report Authenticated By: Malachy Moan, M.D.   Dg Knee Complete 4 Views Right  09/16/2012   *RADIOLOGY REPORT*  Clinical Data: Syncopal episode with knee pain.  Fall.  RIGHT KNEE - COMPLETE 4+ VIEW  Comparison: None.  Findings: No acute fracture or dislocation.  There is artifact projecting over the suprapatellar bursa on the lateral view.  No convincing evidence of joint effusion.  Minimal enthesopathic change at the quadriceps insertion.  IMPRESSION: No acute osseous abnormality.   Original Report Authenticated By: Jeronimo Greaves, M.D.   EKG:NSR, rate of 70 BPM.  ASSESSMENT AND PLAN:   1.Syncopal Episode:  Most likely vasovagal based on description - occurred after a large meal, also feeling of need to urinate, standing up at the time, definite prodrome of lightheadedness. He reports no antecedent history of syncope, no palpitations, no exertional limitations at this point.Marland Kitchen He was not orthostatic on Er assessment. There was no reported evidence of seizure  activity. Doppler studies were negative for obstructive internal carotid disease, but there was a note concerning possibility of considering left subclavian stenosis - however the patient does not have a bruit on examination and reports no symptoms related to use of his arms.  2.History of Hypercholesterolemia: He is followed by Dr. Sudie Bailey for labs. He is not on statin at this time.   Signed: Bettey Mare. Lyman Bishop NP [facility] 09/17/2012, 3:45 PM Co-Sign MD   Attending note:  Patient seen and examined. Reviewed records and modified above note by Ms. Lawrence NP. Chris Ramirez presents after an episode of syncope as outlined, most likely vasovagal in etiology based on description. He reports no palpitations, no exertional symptomatology with fairly physical labor. His cardiac markers are normal, ECG is without acute ST segment changes. Echocardiogram reviewed showing normal LVEF with mild LVH, otherwise no major structural abnormalities or valvular abnormalities. He did have mild ectasia of the ascending aorta. Carotid Dopplers demonstrated no obstructive internal carotid stenoses, questioning the possibility of left subclavian steal in light of early systolic deceleration in the left vertebral, however he has no subclavian bruit on examination and reports no symptoms related to use of his arms. Family history reviewed, no premature cardiovascular disease reported. He does have a personal history of hyperlipidemia, typically runs "low blood pressures," is not a smoker. He has not undergone a baseline ischemic evaluation/stress testing. Cardiac arrhythmia seems very unlkely at this point as an etiology. Should be reasonable for him to be discharged home today. We will schedule a followup exercise echocardiogram for baseline ischemic workup and then see him back in the office. Otherwise discussed basic things to consider in terms of vasovagal syncope, adequate hydration, being alert for potential  triggers, obtaining a seated or supine position when he feels prodromal symptoms. No other specific restrictions anticipated at this time.  Jonelle Sidle, M.D., F.A.C.C.

## 2012-09-17 NOTE — Progress Notes (Signed)
Discharge order received from Dr. Sudie Bailey patient made aware, wife at bedside, IV and telemetry box removed, patient escorted to front entrance with all personal belongings.

## 2012-09-17 NOTE — Progress Notes (Signed)
UR chart review completed.  

## 2012-09-17 NOTE — Progress Notes (Signed)
NAMELEGION, DISCHER NO.:  [id number]  MEDICAL RECORD NO.:  [id number]  LOCATION:  A217                          FACILITY:  [facility]  PHYSICIAN:  Mila Homer. Sudie Bailey, M.D.DATE OF BIRTH:  Jan 27, 1951  DATE OF PROCEDURE: DATE OF DISCHARGE:                                PROGRESS NOTE   SUBJECTIVE:  This patient was admitted to the hospital yesterday after suffering a syncopal episode while standing up after a church lunch.  He currently feels back to normal.  Apparently, he had eaten a number of things and was standing up talking to a few of the people there when he felt dizzy and weak, as he has had in the past at times if he stood up too fast.  He was just thinking about going to sit down in a chair when he passed out and apparently was out for about 1 second.  There was no passive of urine or feces and he had no seizure-like activity.  His wife brought him to the hospital where the workup proceeded.  OBJECTIVE:  VITAL SIGNS:  Temperature is 97.9, pulse 62, respiratory rate 18, blood pressure 100/60. GENERAL:  He is sitting up in bed.  His speech is normal.  His color is good.  His wife is in the room with him. HEART:  Regular rhythm, rate of 70. LUNGS:  Clear throughout.  He is moving air well.  His 12-lead EKG was normal.  He had a normal CBC and CMP.  Troponins have been less than 0.30.  His D-dimer was less than 0.27.  CT scan of the head was normal.  X-ray of the chest was normal, except for a 9 mm well-circumscribed nodular density in the left lung measuring 9 mm, felt to be a nipple shadow or calcified granuloma.  X-ray of the knee was normal (he had fallen on this at the time of his syncopal episode).  His UA was negative.  ASSESSMENT:  Syncopal episode.  PLAN:  This probably represents orthostatic hypotension; however, he was checked for this at the scene by EMS shortly after he fell and apparently had no signs of orthostatic hypertension.  I am  asking Cardiology see him to rule out an arrhythmia.     Mila Homer. Sudie Bailey, M.D.     SDK/MEDQ  D:  09/17/2012  T:  09/17/2012  Job:  [id number]

## 2012-09-21 ENCOUNTER — Ambulatory Visit (HOSPITAL_COMMUNITY): Admit: 2012-09-21 | Payer: 59

## 2012-09-28 ENCOUNTER — Encounter: Payer: 59 | Admitting: Adult Health

## 2012-10-04 ENCOUNTER — Ambulatory Visit (HOSPITAL_COMMUNITY)
Admission: RE | Admit: 2012-10-04 | Discharge: 2012-10-04 | Disposition: A | Discharge status: Home / Self Care | Payer: 59 | Source: Ambulatory Visit | Attending: Cardiology | Admitting: Cardiology

## 2012-10-04 ENCOUNTER — Encounter (HOSPITAL_COMMUNITY): Payer: Self-pay

## 2012-10-04 DIAGNOSIS — R55 Syncope and collapse: Secondary | ICD-10-CM

## 2012-10-04 NOTE — Progress Notes (Signed)
Stress Lab Nurses Notes - Chris Ramirez  Chris Ramirez 10/04/2012 Reason for doing test: Syncope Type of test: Stress Echo Nurse performing test: Parke Poisson, RN Nuclear Medicine Tech: Not Applicable Echo Tech: Karrie Doffing MD performing test: R. Dietrich Pates Family MD: Sudie Bailey Test explained and consent signed: yes IV started: No IV started Symptoms: Fatigue in legs Treatment/Intervention: None Reason test stopped: fatigue After recovery IV was: NA Patient to return to Nuc. Med at : NA Patient discharged: Home Patient's Condition upon discharge was: stable Comments: During test peak BP 173/62 & HR 150.  Recovery BP 139/69 & HR 99 .  Symptoms resolved in recovery. [name] T

## 2012-10-04 NOTE — Progress Notes (Signed)
*  PRELIMINARY RESULTS* Echocardiogram Echocardiogram Stress Test has been performed.  Conrad [place] 10/04/2012, 11:43 AM

## 2012-10-11 ENCOUNTER — Ambulatory Visit (INDEPENDENT_AMBULATORY_CARE_PROVIDER_SITE_OTHER): Payer: 59 | Admitting: Adult Health

## 2012-10-11 ENCOUNTER — Encounter: Payer: Self-pay | Admitting: Adult Health

## 2012-10-11 VITALS — BP 132/84 | HR 67 | Ht 70.0 in | Wt 209.0 lb

## 2012-10-11 DIAGNOSIS — R55 Syncope and collapse: Secondary | ICD-10-CM

## 2012-10-11 NOTE — Assessment & Plan Note (Signed)
No further episodes of syncope. This is likely a vasovagal episode. He was wearing a tie and had been standing for a long period of time as well. We discussed lower leg contraction to avoid dizziness if having long periods of standing, especially after eating a large meal. Will see him on prn basis.  If symptoms reoccur, will place cardiac monitor.

## 2012-10-11 NOTE — Progress Notes (Signed)
   HPI: Mr. Juba is a 62 year old patient of Dr. Diona Browner we are following for ongoing assessment and management of a syncopal episode requiring hospitalization on 09/16/2012. The patient was also has a history of hypercholesterolemia followed by Dr. Sudie Bailey, his primary care physician. He was ruled out for cardiac etiology during hospitalization. 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 He was planned for followup visit in outpatient stress Myoview which was completed on 10/04/2012. Results demonstrated Left ventricular ejection fractionwas normal at rest and with stress. There was no echocardiographic evidence for stress-induced ischemia.   He comes today without recurrent symptoms.  No further syncope, or near syncope.     Allergies  Allergen Reactions  . Ibuprofen Anaphylaxis, Itching and Rash  . Penicillins Itching    Current Outpatient Prescriptions  Medication Sig Dispense Refill  . Carboxymethylcellulose Sodium (THERATEARS) 0.25 % SOLN Apply 1 drop to eye daily as needed.       No current facility-administered medications for this visit.    Past Medical History  Diagnosis Date  . Hyperlipidemia     Managed by diet    Past Surgical History  Procedure Laterality Date  . Colonoscopy  04/26/06    Anal paplla and 2 diminutive rectal polyps(Hyperplastic) other wise normal  . Inguinal hernia repair    . Colonoscopy N/A 07/16/2012    Procedure: COLONOSCOPY;  Surgeon: Corbin Ade, MD;  Location: [facility];  Service: Endoscopy;  Laterality: N/A;  8:30-moved to 10:30 [name] to notify pt    ZOX:WRUEAV of systems complete and found to be negative unless listed above  PHYSICAL EXAM BP 132/84  Pulse 67  Ht 5\' 10"  (1.778 m)  Wt 209 lb (94.802 kg)  BMI 29.99  kg/m2 General: Well developed, well nourished, in no acute distress Head: Eyes PERRLA, No xanthomas.   Normal cephalic and atramatic  Lungs: Clear bilaterally to auscultation and percussion. Heart: HRRR S1 S2, without MRG.  Pulses are 2+ & equal.            No carotid bruit. No JVD.  No abdominal bruits. No femoral bruits. Abdomen: Bowel sounds are positive, abdomen soft and non-tender without masses or                  Hernia's noted. Msk:  Back normal, normal gait. Normal strength and tone for age. Extremities: No clubbing, cyanosis or edema.  DP +1 Neuro: Alert and oriented X 3. Psych:  Good affect, responds appropriately    ASSESSMENT AND PLAN

## 2012-10-11 NOTE — Patient Instructions (Signed)
Your physician recommends that you schedule a follow-up appointment in: PRN 

## 2012-10-15 NOTE — Discharge Summary (Signed)
NAMENIC, LAMPE NO.:  [id number]  MEDICAL RECORD NO.:  [id number]  LOCATION:  A217                          FACILITY:  [facility]  PHYSICIAN:  Mila Homer. Sudie Bailey, M.D.DATE OF BIRTH:  1951-02-14  DATE OF ADMISSION:  09/16/2012 DATE OF DISCHARGE:  06/02/2014LH                              DISCHARGE SUMMARY   This 62 year old was admitted to the hospital with a syncopal episode. He had a benign 2 day hospitalization extending from September 16, 2012 to September 17, 2012.  Evaluation included blood tests, which were essentially normal (Chem profile, CBC, troponin, D-dimer, and TSH), and a urine which was normal. His urine cultures had no growth.  However, he had a chest x-ray, x-ray of his right knee, carotid ultrasounds, and CT of the head, which revealed normal except for a trace of atherosclerotic plaque in the carotid arteries.  His 12-lead EKG showed a normal sinus rhythm with a rate of 74, and was felt to be normal.  He was admitted to the hospital and was on IV fluids, was seen by Cardiology, who felt he had vasovagal reaction, he was able to be discharged his 2nd day.  FINAL DISCHARGE DIAGNOSES: 1. Syncopal episode. 2. Vasovagal reaction. He will have followup in my office as needed and with cardiology for followup and further woke up.     Mila Homer. Sudie Bailey, M.D.     SDK/MEDQ  D:  10/15/2012  T:  10/15/2012  Job:  [id number]

## 2014-01-14 IMAGING — CT CT HEAD W/O CM
1 series · 16 of 30 positions shown, 20 images · non-contrast
Comparison: None.

CLINICAL DATA: Syncopal episode.

CT HEAD WITHOUT CONTRAST
TECHNIQUE: Contiguous axial images were obtained from the base of
the skull through the vertex without contrast.

[Series 2: headtrauma 4.8 h37s · axial · 0.48mm/px · z∈[+77,+237]mm · 16 of 36 slices shown, 20 images]
[im 2/36  brain]
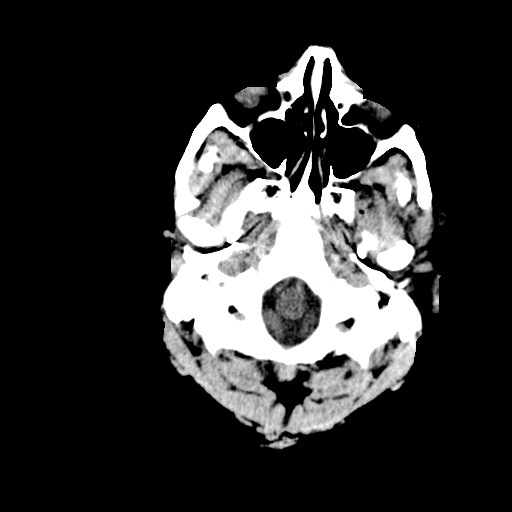
[im 2/36  bone]
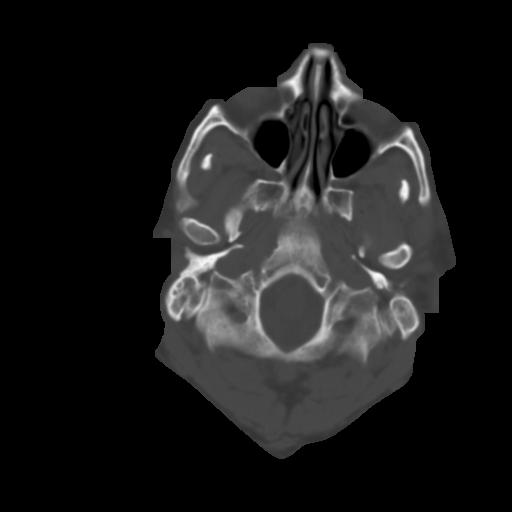
[im 4/36  brain]
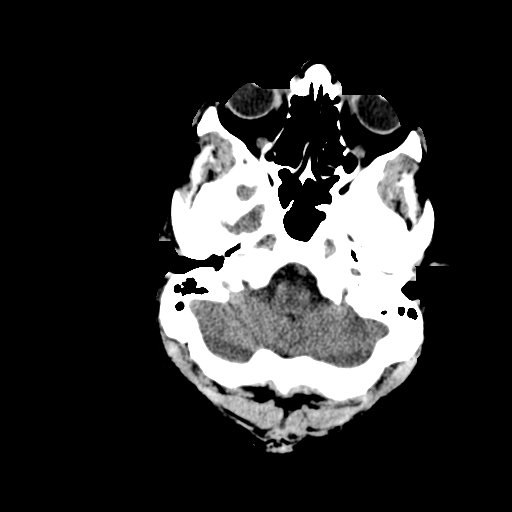
[im 7/36  brain]
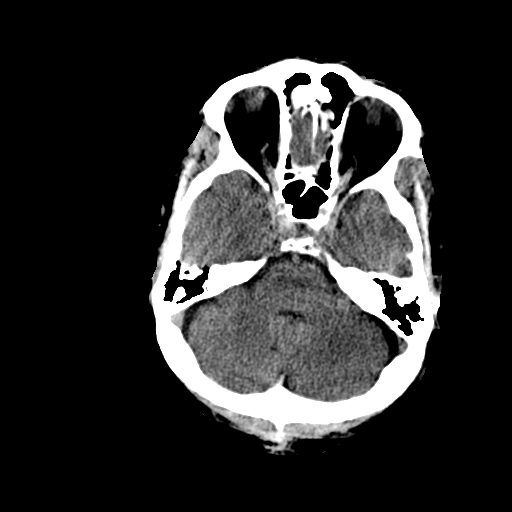
[im 9/36  brain]
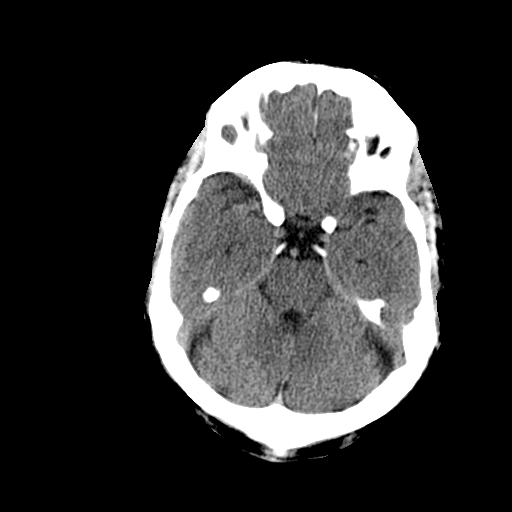
[im 10/36  brain]
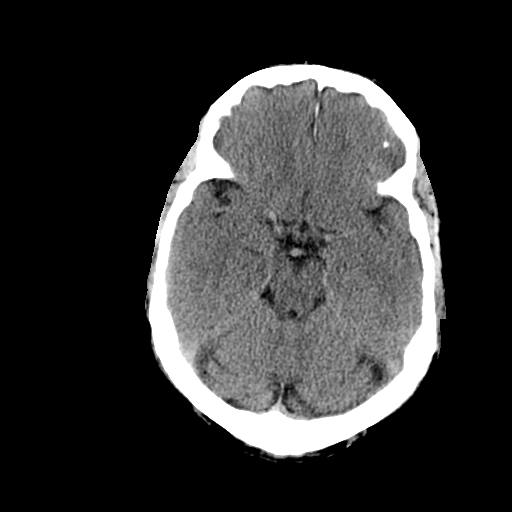
[im 10/36  bone]
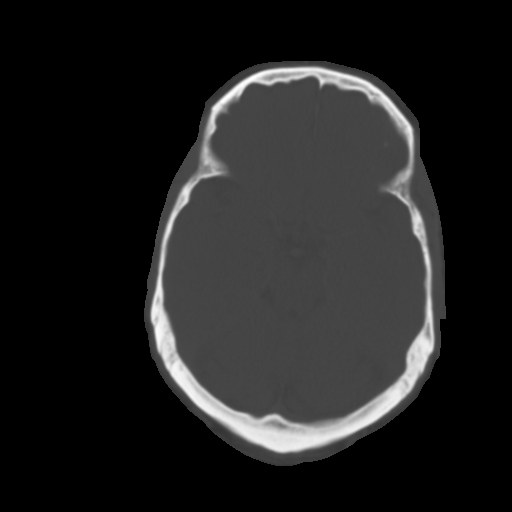
[im 13/36  brain]
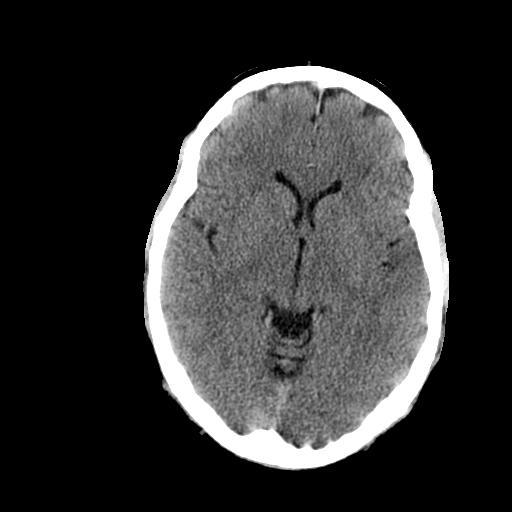
[im 15/36  brain]
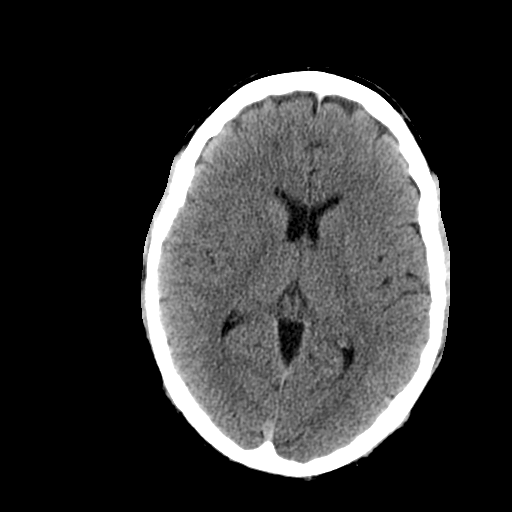
[im 17/36  brain]
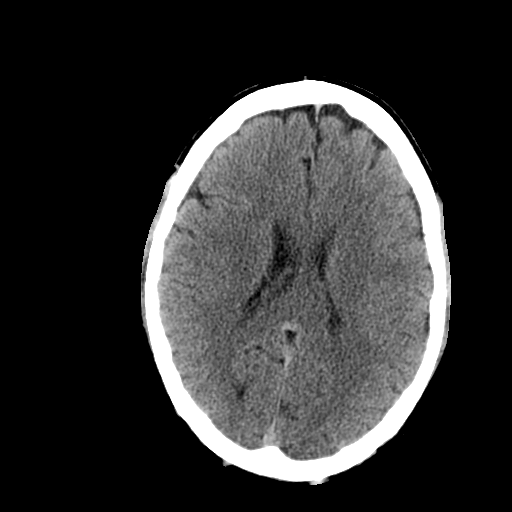
[im 19/36  brain]
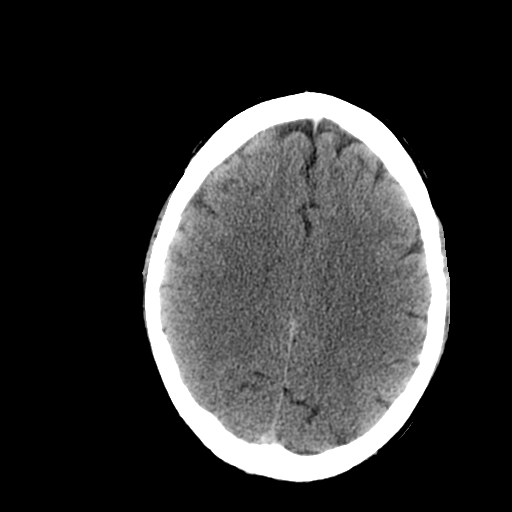
[im 19/36  bone]
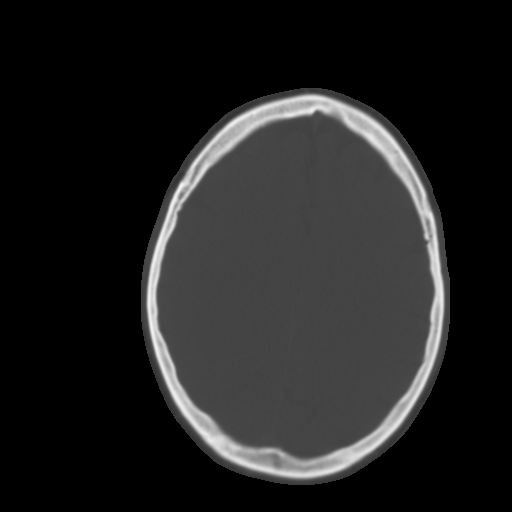
[im 21/36  brain]
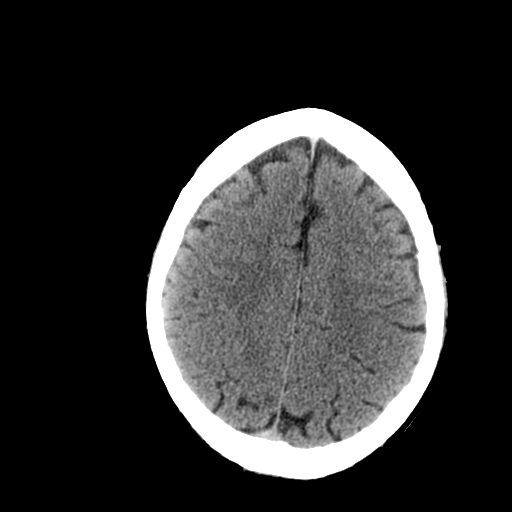
[im 23/36  brain]
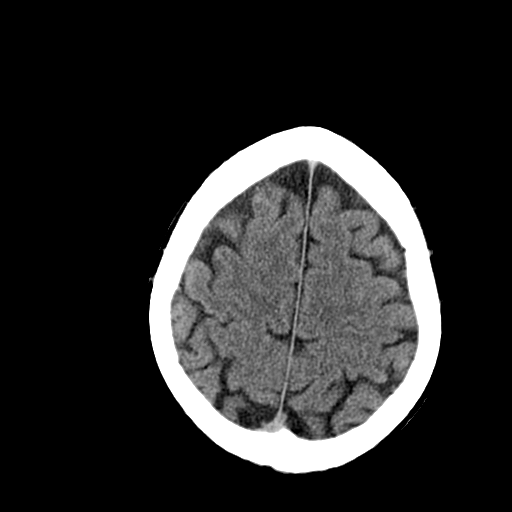
[im 26/36  brain]
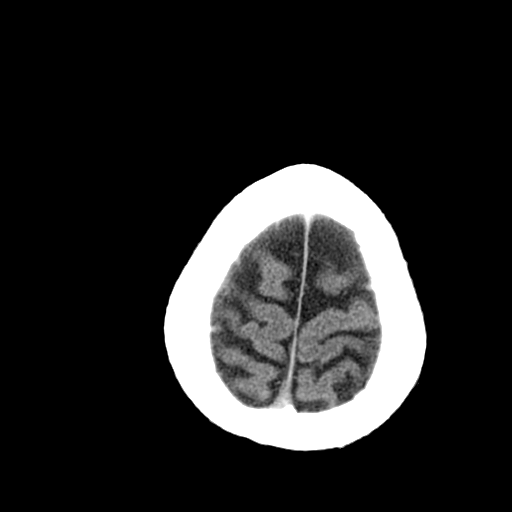
[im 27/36  brain]
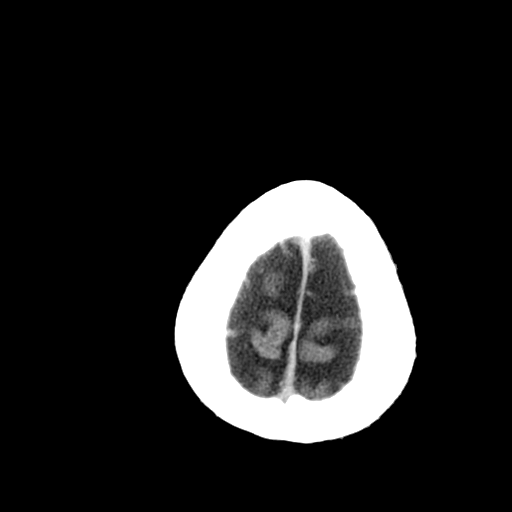
[im 27/36  bone]
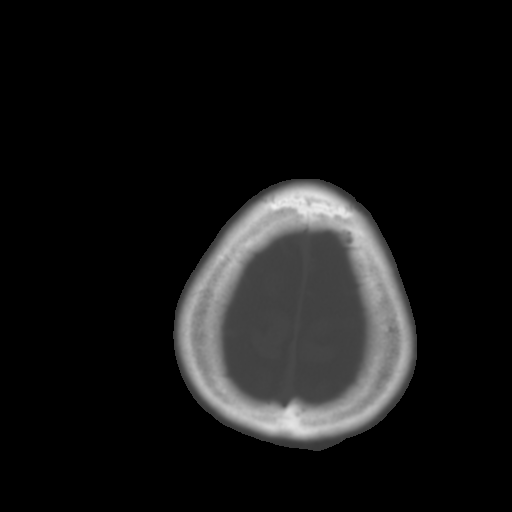
[im 29/36  brain]
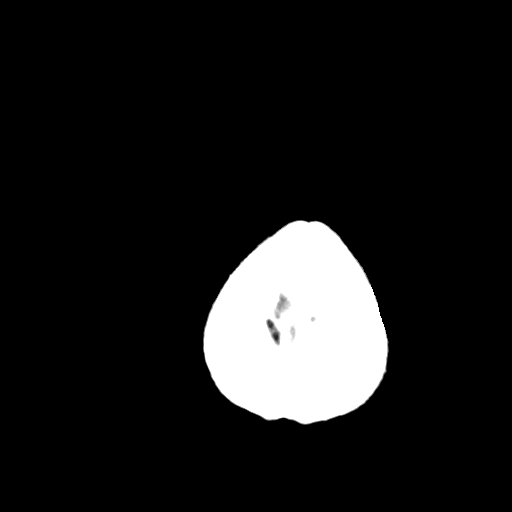
[im 32/36  brain]
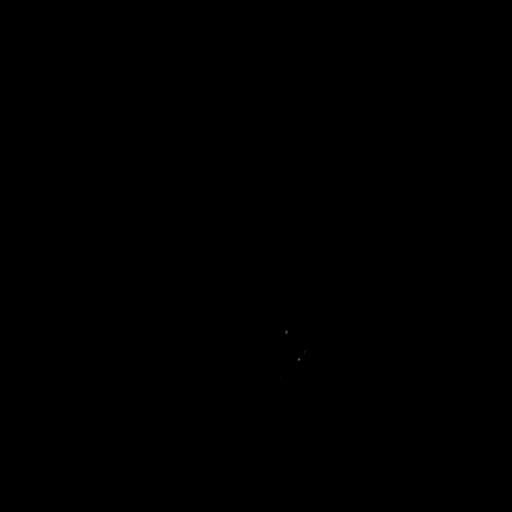
[im 34/36  brain]
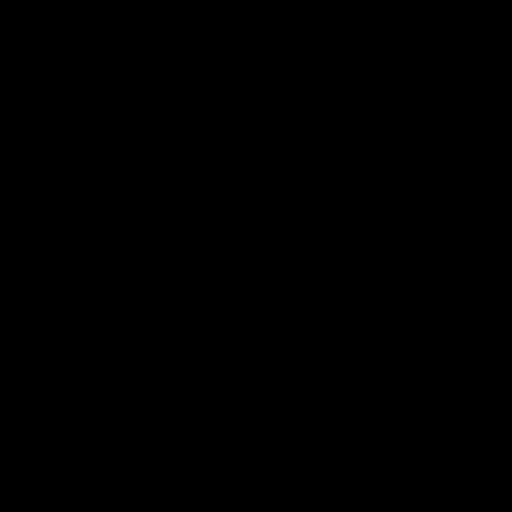

[16 of 30 positions shown; findings below may reference images not displayed]

FINDINGS: No acute intracranial abnormality is present.
Specifically, there is no evidence for acute infarct, hemorrhage,
mass, hydrocephalus, or extra-axial fluid collection.  The
paranasal sinuses and mastoid air cells are clear.  The globes and
orbits are intact.  The osseous skull is intact.
IMPRESSION: Negative CT of the head.

## 2014-04-03 ENCOUNTER — Other Ambulatory Visit: Payer: Self-pay | Admitting: Dermatology

## 2015-01-12 ENCOUNTER — Other Ambulatory Visit: Payer: Self-pay | Admitting: Surgery

## 2015-01-15 ENCOUNTER — Encounter (HOSPITAL_BASED_OUTPATIENT_CLINIC_OR_DEPARTMENT_OTHER): Payer: Self-pay | Admitting: *Deleted

## 2015-01-20 NOTE — H&P (Signed)
Chris Ramirez 01/12/2015 9:58 AM Location: [name] Patient #: [id number] DOB: 09-22-1950 Married / Language: Chris Ramirez / Race: White Male  History of Present Illness ([name] [name] MD; 01/12/2015 10:14 AM) The patient is a 64 year old male who presents with an inguinal hernia. This is a pleasant gentleman referred by Dr. Leslie Andrea for evaluation of a left inguinal hernia. The patient reports he may have had a hernia some time but just noticed it about 6 weeks ago. He is actually wearing a truss because it sticks out most time now. He has almost no discomfort and no obstructive symptoms. He had a previous right inguinal hernia repair back when he was in college. He is otherwise healthy without complaints   Other Problems [name], CMA; 01/12/2015 9:58 AM) Hypercholesterolemia Inguinal Hernia  Past Surgical History [name], CMA; 01/12/2015 9:58 AM) Colon Polyp Removal - Colonoscopy Open Inguinal Hernia Surgery Right.  Diagnostic Studies History [name], CMA; 01/12/2015 9:58 AM) Colonoscopy 1-5 years ago  Allergies [name], CMA; 01/12/2015 9:59 AM) Penicillin V *PENICILLINS* Ibuprofen *ANALGESICS - ANTI-INFLAMMATORY* Anaphylaxis, Itching, Rash.  Medication History [name], [place]; 01/12/2015 9:59 AM) No Current Medications Medications Reconciled  Social History [name], [place]; 01/12/2015 9:58 AM) Alcohol use Occasional alcohol use. Caffeine use Carbonated beverages. No drug use Tobacco use Never smoker.  Family History [name], [place]; 01/12/2015 9:58 AM) Arthritis Mother. Colon Cancer Father. Diabetes Mellitus Mother. Heart Disease Father, Mother. Hypertension Father, Mother, Son.  Review of Systems [name] CMA; 01/12/2015 9:58 AM) General Not Present- Appetite Loss, Chills, Fatigue, Fever, Night Sweats, Weight Gain and Weight Loss. Skin Not Present- Change in Wart/Mole, Dryness, Hives, Jaundice, New  Lesions, Non-Healing Wounds, Rash and Ulcer. HEENT Not Present- Earache, Hearing Loss, Hoarseness, Nose Bleed, Oral Ulcers, Ringing in the Ears, Seasonal Allergies, Sinus Pain, Sore Throat, Visual Disturbances, Wears glasses/contact lenses and Yellow Eyes. Respiratory Not Present- Bloody sputum, Chronic Cough, Difficulty Breathing, Snoring and Wheezing. Breast Not Present- Breast Mass, Breast Pain, Nipple Discharge and Skin Changes. Cardiovascular Not Present- Chest Pain, Difficulty Breathing Lying Down, Leg Cramps, Palpitations, Rapid Heart Rate, Shortness of Breath and Swelling of Extremities. Gastrointestinal Not Present- Abdominal Pain, Bloating, Bloody Stool, Change in Bowel Habits, Chronic diarrhea, Constipation, Difficulty Swallowing, Excessive gas, Gets full quickly at meals, Hemorrhoids, Indigestion, Nausea, Rectal Pain and Vomiting. Male Genitourinary Not Present- Blood in Urine, Change in Urinary Stream, Frequency, Impotence, Nocturia, Painful Urination, Urgency and Urine Leakage. Musculoskeletal Not Present- Back Pain, Joint Pain, Joint Stiffness, Muscle Pain, Muscle Weakness and Swelling of Extremities. Neurological Not Present- Decreased Memory, Fainting, Headaches, Numbness, Seizures, Tingling, Tremor, Trouble walking and Weakness. Psychiatric Not Present- Anxiety, Bipolar, Change in Sleep Pattern, Depression, Fearful and Frequent crying. Endocrine Not Present- Cold Intolerance, Excessive Hunger, Hair Changes, Heat Intolerance, Hot flashes and New Diabetes. Hematology Not Present- Easy Bruising, Excessive bleeding, Gland problems, HIV and Persistent Infections.   Vitals [name] CMA; 01/12/2015 9:59 AM) 01/12/2015 9:59 AM Weight: 219 lb Height: 70in Body Surface Area: 2.21 m Body Mass Index: 31.42 kg/m Temp.: 98.76F(Temporal)  Pulse: 81 (Regular)  BP: 130/70 (Sitting, Left Arm, Standard)    Physical Exam ([name] [name] MD; 01/12/2015 10:14  AM) General Mental Status-Alert. General Appearance-Consistent with stated age. Hydration-Well hydrated. Voice-Normal.  Head and Neck Head-normocephalic, atraumatic with no lesions or palpable masses. Trachea-midline.  Eye Eyeball - Bilateral-Extraocular movements intact. Sclera/Conjunctiva - Bilateral-No scleral icterus.  Chest and Lung Exam Chest and lung exam reveals -quiet, even  and easy respiratory effort with no use of accessory muscles and on auscultation, normal breath sounds, no adventitious sounds and normal vocal resonance. Inspection Chest Wall - Normal. Back - normal.  Cardiovascular Cardiovascular examination reveals -normal heart sounds, regular rate and rhythm with no murmurs and normal pedal pulses bilaterally.  Abdomen Inspection Skin - Scar - no surgical scars. Hernias - Inguinal hernia - Left - Reducible. Palpation/Percussion Palpation and Percussion of the abdomen reveal - Soft, Non Tender, No Rebound tenderness, No Rigidity (guarding) and No hepatosplenomegaly. Auscultation Auscultation of the abdomen reveals - Bowel sounds normal.  Neurologic Neurologic evaluation reveals -alert and oriented x 3 with no impairment of recent or remote memory. Mental Status-Normal.  Musculoskeletal Normal Exam - Left-Upper Extremity Strength Normal and Lower Extremity Strength Normal. Normal Exam - Right-Upper Extremity Strength Normal, Lower Extremity Weakness.    Assessment & Plan ([name] [name] MD; 01/12/2015 10:15 AM  LEFT INGUINAL HERNIA (K40.90  Impression: I discussed the diagnosis with him. I discussed laparoscopic left inguinal hernia repair with mesh. I discussed both the laparoscopic and open techniques. After discussion, he wished to proceed with a laparoscopic left inguinal hernia repair with mesh. I discussed the risk which includes but is not limited to bleeding, infection, injury to surrounding structures, chronic pain,  recurrent hernia, postoperative recovery, etc. He understands and wished to proceed with surgery. Current Plans  Pt Education - Pamphlet Given - Laparoscopic Hernia Repair: discussed with patient and provided information.

## 2015-01-21 ENCOUNTER — Ambulatory Visit (HOSPITAL_BASED_OUTPATIENT_CLINIC_OR_DEPARTMENT_OTHER): Payer: 59 | Admitting: Anesthesiology

## 2015-01-21 ENCOUNTER — Ambulatory Visit (HOSPITAL_BASED_OUTPATIENT_CLINIC_OR_DEPARTMENT_OTHER)
Admission: RE | Admit: 2015-01-21 | Discharge: 2015-01-21 | Disposition: A | Discharge status: Home / Self Care | Payer: 59 | Source: Ambulatory Visit | Attending: Surgery | Admitting: Surgery

## 2015-01-21 ENCOUNTER — Encounter (HOSPITAL_BASED_OUTPATIENT_CLINIC_OR_DEPARTMENT_OTHER)
Admission: RE | Disposition: A | Discharge status: Home / Self Care | Payer: Self-pay | Source: Ambulatory Visit | Attending: Surgery

## 2015-01-21 ENCOUNTER — Encounter (HOSPITAL_BASED_OUTPATIENT_CLINIC_OR_DEPARTMENT_OTHER): Payer: Self-pay

## 2015-01-21 DIAGNOSIS — K4090 Unilateral inguinal hernia, without obstruction or gangrene, not specified as recurrent: Secondary | ICD-10-CM | POA: Insufficient documentation

## 2015-01-21 HISTORY — PX: INGUINAL HERNIA REPAIR: SHX194

## 2015-01-21 HISTORY — PX: INSERTION OF MESH: SHX5868

## 2015-01-21 SURGERY — REPAIR, HERNIA, INGUINAL, LAPAROSCOPIC
Anesthesia: General | Site: Abdomen | Laterality: Left

## 2015-01-21 MED ORDER — CIPROFLOXACIN IN D5W 400 MG/200ML IV SOLN
400.0000 mg | INTRAVENOUS | Status: AC
  Administered 2015-01-21: 400 mg via INTRAVENOUS

## 2015-01-21 MED ORDER — LIDOCAINE HCL (CARDIAC) 20 MG/ML IV SOLN
INTRAVENOUS | Status: AC
  Filled 2015-01-21: qty 5

## 2015-01-21 MED ORDER — LIDOCAINE HCL (CARDIAC) 20 MG/ML IV SOLN
INTRAVENOUS | Status: DC | PRN
  Administered 2015-01-21: 80 mg via INTRAVENOUS

## 2015-01-21 MED ORDER — GLYCOPYRROLATE 0.2 MG/ML IJ SOLN
0.2000 mg | Freq: Once | INTRAMUSCULAR | Status: AC | PRN
  Administered 2015-01-21: 0.4 mg via INTRAVENOUS

## 2015-01-21 MED ORDER — MIDAZOLAM HCL 2 MG/2ML IJ SOLN
1.0000 mg | INTRAMUSCULAR | Status: DC | PRN
  Administered 2015-01-21: 2 mg via INTRAVENOUS

## 2015-01-21 MED ORDER — OXYCODONE HCL 5 MG/5ML PO SOLN: 5.0000 mg | Freq: Once | ORAL | Status: DC | PRN

## 2015-01-21 MED ORDER — PROPOFOL 10 MG/ML IV BOLUS
INTRAVENOUS | Status: AC
  Filled 2015-01-21: qty 20

## 2015-01-21 MED ORDER — HYDROMORPHONE HCL 1 MG/ML IJ SOLN: 0.2500 mg | INTRAMUSCULAR | Status: DC | PRN

## 2015-01-21 MED ORDER — CIPROFLOXACIN IN D5W 400 MG/200ML IV SOLN
INTRAVENOUS | Status: AC
  Filled 2015-01-21: qty 200

## 2015-01-21 MED ORDER — PROPOFOL 10 MG/ML IV BOLUS
INTRAVENOUS | Status: DC | PRN
  Administered 2015-01-21: 300 mg via INTRAVENOUS

## 2015-01-21 MED ORDER — FENTANYL CITRATE (PF) 100 MCG/2ML IJ SOLN
INTRAMUSCULAR | Status: AC
  Filled 2015-01-21: qty 4

## 2015-01-21 MED ORDER — MIDAZOLAM HCL 2 MG/2ML IJ SOLN
INTRAMUSCULAR | Status: AC
  Filled 2015-01-21: qty 4

## 2015-01-21 MED ORDER — LACTATED RINGERS IV SOLN
INTRAVENOUS | Status: DC
Start: 2015-01-21 — End: 2015-01-21
  Administered 2015-01-21 (×2): via INTRAVENOUS

## 2015-01-21 MED ORDER — ONDANSETRON HCL 4 MG/2ML IJ SOLN
INTRAMUSCULAR | Status: DC | PRN
  Administered 2015-01-21: 4 mg via INTRAVENOUS

## 2015-01-21 MED ORDER — SCOPOLAMINE 1 MG/3DAYS TD PT72: 1.0000 | MEDICATED_PATCH | Freq: Once | TRANSDERMAL | Status: DC | PRN

## 2015-01-21 MED ORDER — BUPIVACAINE-EPINEPHRINE (PF) 0.5% -1:200000 IJ SOLN
INTRAMUSCULAR | Status: AC
  Filled 2015-01-21: qty 30

## 2015-01-21 MED ORDER — MEPERIDINE HCL 25 MG/ML IJ SOLN: 6.2500 mg | INTRAMUSCULAR | Status: DC | PRN

## 2015-01-21 MED ORDER — DEXAMETHASONE SODIUM PHOSPHATE 4 MG/ML IJ SOLN
INTRAMUSCULAR | Status: DC | PRN
  Administered 2015-01-21: 10 mg via INTRAVENOUS

## 2015-01-21 MED ORDER — EPHEDRINE SULFATE 50 MG/ML IJ SOLN
INTRAMUSCULAR | Status: DC | PRN
  Administered 2015-01-21: 10 mg via INTRAVENOUS

## 2015-01-21 MED ORDER — FENTANYL CITRATE (PF) 100 MCG/2ML IJ SOLN
50.0000 ug | INTRAMUSCULAR | Status: DC | PRN
  Administered 2015-01-21: 100 ug via INTRAVENOUS
  Administered 2015-01-21: 50 ug via INTRAVENOUS

## 2015-01-21 MED ORDER — BUPIVACAINE-EPINEPHRINE (PF) 0.5% -1:200000 IJ SOLN
INTRAMUSCULAR | Status: DC | PRN
  Administered 2015-01-21: 20 mL

## 2015-01-21 MED ORDER — OXYCODONE HCL 5 MG PO TABS: 5.0000 mg | ORAL_TABLET | Freq: Once | ORAL | Status: DC | PRN

## 2015-01-21 MED ORDER — OXYCODONE-ACETAMINOPHEN 5-325 MG PO TABS: 1.0000 | ORAL_TABLET | ORAL | Status: DC | PRN

## 2015-01-21 MED ORDER — ROCURONIUM BROMIDE 100 MG/10ML IV SOLN
INTRAVENOUS | Status: DC | PRN
  Administered 2015-01-21: 30 mg via INTRAVENOUS

## 2015-01-21 MED ORDER — LIDOCAINE HCL 4 % MT SOLN
OROMUCOSAL | Status: DC | PRN
  Administered 2015-01-21: 3 mL via TOPICAL

## 2015-01-21 MED ORDER — NEOSTIGMINE METHYLSULFATE 10 MG/10ML IV SOLN
INTRAVENOUS | Status: DC | PRN
  Administered 2015-01-21: 3 mg via INTRAVENOUS

## 2015-01-21 SURGICAL SUPPLY — 30 items
APPLIER CLIP LOGIC TI 5 (MISCELLANEOUS) IMPLANT
BLADE CLIPPER SURG (BLADE) ×3 IMPLANT
CHLORAPREP W/TINT 26ML (MISCELLANEOUS) ×3 IMPLANT
DEVICE SECURE STRAP 25 ABSORB (INSTRUMENTS) ×3 IMPLANT
DISSECT BALLN SPACEMKR + OVL (BALLOONS) ×3
DISSECTOR BALLN SPACEMKR + OVL (BALLOONS) ×1 IMPLANT
DISSECTOR BLUNT TIP ENDO 5MM (MISCELLANEOUS) IMPLANT
ELECT REM PT RETURN 9FT ADLT (ELECTROSURGICAL) ×3
ELECTRODE REM PT RTRN 9FT ADLT (ELECTROSURGICAL) ×1 IMPLANT
GLOVE BIOGEL PI IND STRL 7.0 (GLOVE) ×2 IMPLANT
GLOVE BIOGEL PI INDICATOR 7.0 (GLOVE) ×4
GLOVE ECLIPSE 6.5 STRL STRAW (GLOVE) ×3 IMPLANT
GLOVE SURG SIGNA 7.5 PF LTX (GLOVE) ×3 IMPLANT
GOWN STRL REUS W/ TWL LRG LVL3 (GOWN DISPOSABLE) ×1 IMPLANT
GOWN STRL REUS W/ TWL XL LVL3 (GOWN DISPOSABLE) ×1 IMPLANT
GOWN STRL REUS W/TWL LRG LVL3 (GOWN DISPOSABLE) ×2
GOWN STRL REUS W/TWL XL LVL3 (GOWN DISPOSABLE) ×2
LIQUID BAND (GAUZE/BANDAGES/DRESSINGS) ×3 IMPLANT
MESH 3DMAX 4X6 LT LRG (Mesh General) ×3 IMPLANT
NEEDLE INSUFFLATION 14GA 120MM (NEEDLE) IMPLANT
PACK BASIN DAY SURGERY FS (CUSTOM PROCEDURE TRAY) ×3 IMPLANT
SCISSORS LAP 5X35 DISP (ENDOMECHANICALS) IMPLANT
SET IRRIG TUBING LAPAROSCOPIC (IRRIGATION / IRRIGATOR) IMPLANT
SET TROCAR LAP APPLE-HUNT 5MM (ENDOMECHANICALS) ×3 IMPLANT
SLEEVE SCD COMPRESS KNEE MED (MISCELLANEOUS) ×3 IMPLANT
SUT MNCRL AB 4-0 PS2 18 (SUTURE) ×3 IMPLANT
TOWEL OR 17X24 6PK STRL BLUE (TOWEL DISPOSABLE) ×3 IMPLANT
TRAY FOLEY CATH SILVER 16FR (SET/KITS/TRAYS/PACK) ×3 IMPLANT
TRAY LAPAROSCOPIC (CUSTOM PROCEDURE TRAY) ×3 IMPLANT
TUBING INSUFFLATION (TUBING) ×3 IMPLANT

## 2015-01-21 NOTE — Anesthesia Postprocedure Evaluation (Signed)
  Anesthesia Post-op Note  Patient: Chris Ramirez  Procedure(s) Performed: Procedure(s): LAPAROSCOPIC LEFT INGUINAL HERNIA REPAIR WITH MESH (Left) INSERTION OF MESH (Left)  Patient Location: PACU  Anesthesia Type: General   Level of Consciousness: awake, alert  and oriented  Airway and Oxygen Therapy: Patient Spontanous Breathing  Post-op Pain: none  Post-op Assessment: Post-op Vital signs reviewed  Post-op Vital Signs: Reviewed  Last Vitals:  Filed Vitals:   01/21/15 1315  BP: 124/76  Pulse: 64  Temp:   Resp: 12    Complications: No apparent anesthesia complications

## 2015-01-21 NOTE — Discharge Instructions (Signed)
[facility] ______[facility] LAPAROSCOPIC SURGERY: POST OP INSTRUCTIONS Always review your discharge instruction sheet given to you by the facility where your surgery was performed. IF YOU HAVE DISABILITY OR FAMILY LEAVE FORMS, YOU MUST BRING THEM TO THE OFFICE FOR PROCESSING.   DO NOT GIVE THEM TO YOUR DOCTOR.  1. A prescription for pain medication may be given to you upon discharge.  Take your pain medication as prescribed, if needed.  If narcotic pain medicine is not needed, then you may take acetaminophen (Tylenol) or ibuprofen (Advil) as needed. 2. Take your usually prescribed medications unless otherwise directed. 3. If you need a refill on your pain medication, please contact your pharmacy.  They will contact our office to request authorization. Prescriptions will not be filled after 5pm or on week-ends. 4. You should follow a light diet the first few days after arrival home, such as soup and crackers, etc.  Be sure to include lots of fluids daily. 5. Most patients will experience some swelling and bruising in the area of the incisions.  Ice packs will help.  Swelling and bruising can take several days to resolve.  6. It is common to experience some constipation if taking pain medication after surgery.  Increasing fluid intake and taking a stool softener (such as Colace) will usually help or prevent this problem from occurring.  A mild laxative (Milk of Magnesia or Miralax) should be taken according to package instructions if there are no bowel movements after 48 hours. 7. Unless discharge instructions indicate otherwise, you may remove your bandages 24-48 hours after surgery, and you may shower at that time.  You may have steri-strips (small skin tapes) in place directly over the incision.  These strips should be left on the skin for 7-10 days.  If your surgeon used skin glue on the incision, you may shower in 24 hours.  The glue will flake off over the next 2-3 weeks.  Any sutures or  staples will be removed at the office during your follow-up visit. 8. ACTIVITIES:  You may resume regular (light) daily activities beginning the next day--such as daily self-care, walking, climbing stairs--gradually increasing activities as tolerated.  You may have sexual intercourse when it is comfortable.  Refrain from any heavy lifting or straining until approved by your doctor. a. You may drive when you are no longer taking prescription pain medication, you can comfortably wear a seatbelt, and you can safely maneuver your car and apply brakes. b. RETURN TO WORK:  __________________________________________________________ 9. You should see your doctor in the office for a follow-up appointment approximately 2-3 weeks after your surgery.  Make sure that you call for this appointment within a day or two after you arrive home to insure a convenient appointment time. 10. OTHER INSTRUCTIONS: _NO LIFTING MORE THAN 15 POUNDS FOR 3 WEEKS. 11. ICE PACK ALSO FOR PAIN_________________________________________________________________________________________________________________________ __________________________________________________________________________________________________________________________ WHEN TO CALL YOUR DOCTOR: 1. Fever over 101.0 2. Inability to urinate 3. Continued bleeding from incision. 4. Increased pain, redness, or drainage from the incision. 5. Increasing abdominal pain  The clinic staff is available to answer your questions during regular business hours.  Please dont hesitate to call and ask to speak to one of the nurses for clinical concerns.  If you have a medical emergency, go to the nearest emergency room or call 911.  A surgeon from [facility] is always on call at the hospital. [address], [place], [place], [place]  [address] ? [id number], [place],  [place]   [address] [phone] ? [phone] ? FAX [phone] Web site:  [email or web address]   Post Anesthesia Home Care Instructions  Activity: Get plenty of rest for the remainder of the day. A responsible adult should stay with you for 24 hours following the procedure.  For the next 24 hours, DO NOT: -Drive a car -Paediatric nurse -Drink alcoholic beverages -Take any medication unless instructed by your physician -Make any legal decisions or sign important papers.  Meals: Start with liquid foods such as gelatin or soup. Progress to regular foods as tolerated. Avoid greasy, spicy, heavy foods. If nausea and/or vomiting occur, drink only clear liquids until the nausea and/or vomiting subsides. Call your physician if vomiting continues.  Special Instructions/Symptoms: Your throat may feel dry or sore from the anesthesia or the breathing tube placed in your throat during surgery. If this causes discomfort, gargle with warm salt water. The discomfort should disappear within 24 hours.  If you had a scopolamine patch placed behind your ear for the management of post- operative nausea and/or vomiting:  1. The medication in the patch is effective for 72 hours, after which it should be removed.  Wrap patch in a tissue and discard in the trash. Wash hands thoroughly with soap and water. 2. You may remove the patch earlier than 72 hours if you experience unpleasant side effects which may include dry mouth, dizziness or visual disturbances. 3. Avoid touching the patch. Wash your hands with soap and water after contact with the patch.

## 2015-01-21 NOTE — Anesthesia Preprocedure Evaluation (Signed)

## 2015-01-21 NOTE — Transfer of Care (Signed)
Immediate Anesthesia Transfer of Care Note  Patient: Chris Ramirez  Procedure(s) Performed: Procedure(s): LAPAROSCOPIC LEFT INGUINAL HERNIA REPAIR WITH MESH (Left) INSERTION OF MESH (Left)  Patient Location: PACU  Anesthesia Type:General  Level of Consciousness: awake, alert  and oriented  Airway & Oxygen Therapy: Patient Spontanous Breathing and Patient connected to face mask oxygen  Post-op Assessment: Report given to RN and Post -op Vital signs reviewed and stable  Post vital signs: Reviewed and stable  Last Vitals:  Filed Vitals:   01/21/15 1032  BP: 121/81  Pulse: 67  Temp: 36.9 C  Resp: 20    Complications: No apparent anesthesia complications

## 2015-01-21 NOTE — Op Note (Signed)
LAPAROSCOPIC LEFT INGUINAL HERNIA REPAIR WITH MESH, INSERTION OF MESH  Procedure Note  DVAUGHN FICKLE 01/21/2015   Pre-op Diagnosis: Left Inguinal Hernia     Post-op Diagnosis: same  Procedure(s): LAPAROSCOPIC LEFT INGUINAL HERNIA REPAIR WITH MESH INSERTION OF MESH  Surgeon(s): Coralie Keens, MD  Anesthesia: General  Staff:  Circulator: Izora Ribas, RN Scrub Person: Lynelle Doctor, RN Circulator Assistant: Silvio Clayman, CST  Estimated Blood Loss: Minimal                         Angelica Wix A   Date: 01/21/2015  Time: 12:04 PM

## 2015-01-21 NOTE — Interval H&P Note (Signed)
History and Physical Interval Note: NO CHANGE IN H AND P  01/21/2015 10:00 AM  [place]  has presented today for surgery, with the diagnosis of Left Inguinal Hernia  The various methods of treatment have been discussed with the patient and family. After consideration of risks, benefits and other options for treatment, the patient has consented to  Procedure(s): LAPAROSCOPIC LEFT INGUINAL HERNIA REPAIR WITH MESH (Left) as a surgical intervention .  The patient's history has been reviewed, patient examined, no change in status, stable for surgery.  I have reviewed the patient's chart and labs.  Questions were answered to the patient's satisfaction.     Bekim Werntz A

## 2015-01-21 NOTE — Anesthesia Procedure Notes (Signed)
Procedure Name: Intubation Date/Time: 01/21/2015 11:21 AM Performed by: Maryella Shivers Pre-anesthesia Checklist: Patient identified, Emergency Drugs available, Suction available and Patient being monitored Patient Re-evaluated:Patient Re-evaluated prior to inductionOxygen Delivery Method: Circle System Utilized Preoxygenation: Pre-oxygenation with 100% oxygen Intubation Type: IV induction Ventilation: Mask ventilation without difficulty Laryngoscope Size: Mac and 3 Grade View: Grade III Tube type: Oral Tube size: 8.0 mm Number of attempts: 1 Airway Equipment and Method: Stylet and Oral airway Placement Confirmation: ETT inserted through vocal cords under direct vision,  positive ETCO2 and breath sounds checked- equal and bilateral Secured at: 22 cm Tube secured with: Tape Dental Injury: Teeth and Oropharynx as per pre-operative assessment

## 2015-01-22 ENCOUNTER — Encounter (HOSPITAL_BASED_OUTPATIENT_CLINIC_OR_DEPARTMENT_OTHER): Payer: Self-pay | Admitting: Surgery

## 2015-01-22 NOTE — Op Note (Signed)
NAMEJRUE, JARRIEL NO.:  [id number]  MEDICAL RECORD NO.:  [id number]  LOCATION:                               FACILITY:  [facility]  PHYSICIAN:  Coralie Keens, M.D. DATE OF BIRTH:  1950-07-20  DATE OF PROCEDURE:  01/21/2015 DATE OF DISCHARGE:  01/21/2015                              OPERATIVE REPORT   PREOPERATIVE DIAGNOSIS:  Left inguinal hernia.  POSTOPERATIVE DIAGNOSIS:  Left inguinal hernia.  PROCEDURE:  Laparoscopic left inguinal hernia repair with mesh.  SURGEON:  Coralie Keens, M.D.  ANESTHESIA:  General and 0.5% Marcaine.  ESTIMATED BLOOD LOSS:  Minimal.  FINDINGS:  The patient was found to have both an indirect and direct left inguinal hernia.  It was repaired with a piece of 3DMax large Prolene mesh from [employer].  PROCEDURE IN DETAIL:  The patient was brought to the operating room, identified as Passenger transport manager.  He was placed supine on the operating room table.  General anesthesia was induced.  A Foley catheter was then inserted.  His abdomen was then prepped and draped in usual sterile fashion.  I made a small vertical incision below the umbilicus.  I carried this down to the fascia which opened just to the left of the midline.  The rectus muscles were identified and elevated.  The dissecting balloon was then passed underneath the rectus muscle and manipulated towards the pubis.  The dissecting balloon was then insufflated under direct vision dissecting out the preperitoneal space. The dissecting balloon was then removed and insufflation was begun with carbon dioxide.  I then placed two 5-mm ports in the patient's lower midline both under direct vision.  The epigastric vessel on the left was actually pulled away from the abdominal wall.  I had to lift it up in order to identify the cord structures.  The patient had a direct hernia defect as well as an indirect hernia sac.  The indirect hernia sac was quite large, and it took some time to  reduce it from the testicular cord and structures.  Once it was completely reduced, I brought a piece of [employer] 3DMax mesh onto the field.  I placed it through the umbilical port and then opened as an onlay on the left inguinal floor.  I then tacked the mesh to Cooper's ligament up the medial abdominal wall and slightly laterally.  I was able to incorporate it around the epigastric vessels easily.  Good coverage of the testicular cord and the inguinal floor, direct defect appeared to be achieved.  At this point, hemostasis also appeared to be achieved.  All ports removed under direct vision.  The preperitoneal spaces seen to collapse appropriately with the mesh in place.  I then opened up the perineum slightly at the umbilicus to release any air that had gone into the abdominal cavity.  I then closed the fascia with a figure-of-eight 0 Vicryl suture.  All incisions were anesthetized with Marcaine.  I performed a left ilioinguinal nerve block with Marcaine as well.  All skin incisions were closed with 4-0 Monocryl sutures and skin glue.  The patient tolerated the procedure well.  The Foley catheter was removed. All sponge, needle, and instrument  counts were correct at the end of the procedure.  The patient was then extubated in the operating room and taken in stable condition to the recovery room.     Coralie Keens, M.D.     DB/MEDQ  D:  01/21/2015  T:  01/21/2015  Job:  [id number]

## 2016-06-03 DIAGNOSIS — M6208 Separation of muscle (nontraumatic), other site: Secondary | ICD-10-CM | POA: Diagnosis not present

## 2016-06-03 DIAGNOSIS — N451 Epididymitis: Secondary | ICD-10-CM | POA: Diagnosis not present

## 2016-06-03 DIAGNOSIS — E6609 Other obesity due to excess calories: Secondary | ICD-10-CM | POA: Diagnosis not present

## 2016-06-03 DIAGNOSIS — M25551 Pain in right hip: Secondary | ICD-10-CM | POA: Diagnosis not present

## 2016-06-13 DIAGNOSIS — E6609 Other obesity due to excess calories: Secondary | ICD-10-CM | POA: Diagnosis not present

## 2016-06-13 DIAGNOSIS — Z833 Family history of diabetes mellitus: Secondary | ICD-10-CM | POA: Diagnosis not present

## 2016-06-13 DIAGNOSIS — R5383 Other fatigue: Secondary | ICD-10-CM | POA: Diagnosis not present

## 2016-06-13 DIAGNOSIS — E781 Pure hyperglyceridemia: Secondary | ICD-10-CM | POA: Diagnosis not present

## 2016-06-13 DIAGNOSIS — Z125 Encounter for screening for malignant neoplasm of prostate: Secondary | ICD-10-CM | POA: Diagnosis not present

## 2016-06-28 DIAGNOSIS — M5416 Radiculopathy, lumbar region: Secondary | ICD-10-CM | POA: Diagnosis not present

## 2016-07-04 DIAGNOSIS — L821 Other seborrheic keratosis: Secondary | ICD-10-CM | POA: Diagnosis not present

## 2016-07-04 DIAGNOSIS — L814 Other melanin hyperpigmentation: Secondary | ICD-10-CM | POA: Diagnosis not present

## 2016-07-04 DIAGNOSIS — D1801 Hemangioma of skin and subcutaneous tissue: Secondary | ICD-10-CM | POA: Diagnosis not present

## 2016-07-04 DIAGNOSIS — D225 Melanocytic nevi of trunk: Secondary | ICD-10-CM | POA: Diagnosis not present

## 2016-07-04 DIAGNOSIS — Z85828 Personal history of other malignant neoplasm of skin: Secondary | ICD-10-CM | POA: Diagnosis not present

## 2016-07-08 DIAGNOSIS — M5137 Other intervertebral disc degeneration, lumbosacral region: Secondary | ICD-10-CM | POA: Diagnosis not present

## 2016-07-08 DIAGNOSIS — M545 Low back pain: Secondary | ICD-10-CM | POA: Diagnosis not present

## 2016-07-08 DIAGNOSIS — M5136 Other intervertebral disc degeneration, lumbar region: Secondary | ICD-10-CM | POA: Diagnosis not present

## 2016-07-22 DIAGNOSIS — M5416 Radiculopathy, lumbar region: Secondary | ICD-10-CM | POA: Diagnosis not present

## 2016-09-21 DIAGNOSIS — Q142 Congenital malformation of optic disc: Secondary | ICD-10-CM | POA: Diagnosis not present

## 2016-09-21 DIAGNOSIS — H2513 Age-related nuclear cataract, bilateral: Secondary | ICD-10-CM | POA: Diagnosis not present

## 2016-09-21 DIAGNOSIS — H47321 Drusen of optic disc, right eye: Secondary | ICD-10-CM | POA: Diagnosis not present

## 2016-12-14 DIAGNOSIS — E6609 Other obesity due to excess calories: Secondary | ICD-10-CM | POA: Diagnosis not present

## 2016-12-14 DIAGNOSIS — E781 Pure hyperglyceridemia: Secondary | ICD-10-CM | POA: Diagnosis not present

## 2016-12-14 DIAGNOSIS — N529 Male erectile dysfunction, unspecified: Secondary | ICD-10-CM | POA: Diagnosis not present

## 2016-12-14 DIAGNOSIS — B351 Tinea unguium: Secondary | ICD-10-CM | POA: Diagnosis not present

## 2016-12-14 DIAGNOSIS — Z008 Encounter for other general examination: Secondary | ICD-10-CM | POA: Diagnosis not present

## 2017-05-16 ENCOUNTER — Encounter: Payer: Self-pay | Admitting: Internal Medicine

## 2017-05-16 DIAGNOSIS — Z23 Encounter for immunization: Secondary | ICD-10-CM | POA: Diagnosis not present

## 2017-06-06 DIAGNOSIS — E6609 Other obesity due to excess calories: Secondary | ICD-10-CM | POA: Diagnosis not present

## 2017-06-06 DIAGNOSIS — E781 Pure hyperglyceridemia: Secondary | ICD-10-CM | POA: Diagnosis not present

## 2017-06-06 DIAGNOSIS — N529 Male erectile dysfunction, unspecified: Secondary | ICD-10-CM | POA: Diagnosis not present

## 2017-08-14 DIAGNOSIS — Z85828 Personal history of other malignant neoplasm of skin: Secondary | ICD-10-CM | POA: Diagnosis not present

## 2017-08-14 DIAGNOSIS — L821 Other seborrheic keratosis: Secondary | ICD-10-CM | POA: Diagnosis not present

## 2017-08-14 DIAGNOSIS — L570 Actinic keratosis: Secondary | ICD-10-CM | POA: Diagnosis not present

## 2017-08-14 DIAGNOSIS — L814 Other melanin hyperpigmentation: Secondary | ICD-10-CM | POA: Diagnosis not present

## 2017-08-14 DIAGNOSIS — D225 Melanocytic nevi of trunk: Secondary | ICD-10-CM | POA: Diagnosis not present

## 2017-08-14 DIAGNOSIS — D485 Neoplasm of uncertain behavior of skin: Secondary | ICD-10-CM | POA: Diagnosis not present

## 2017-08-14 DIAGNOSIS — D1801 Hemangioma of skin and subcutaneous tissue: Secondary | ICD-10-CM | POA: Diagnosis not present

## 2017-08-15 DIAGNOSIS — D485 Neoplasm of uncertain behavior of skin: Secondary | ICD-10-CM | POA: Diagnosis not present

## 2017-08-15 DIAGNOSIS — L814 Other melanin hyperpigmentation: Secondary | ICD-10-CM | POA: Diagnosis not present

## 2017-08-15 DIAGNOSIS — L570 Actinic keratosis: Secondary | ICD-10-CM | POA: Diagnosis not present

## 2017-09-08 DIAGNOSIS — H9313 Tinnitus, bilateral: Secondary | ICD-10-CM | POA: Diagnosis not present

## 2017-09-08 DIAGNOSIS — H6992 Unspecified Eustachian tube disorder, left ear: Secondary | ICD-10-CM | POA: Diagnosis not present

## 2017-09-08 DIAGNOSIS — H903 Sensorineural hearing loss, bilateral: Secondary | ICD-10-CM | POA: Diagnosis not present

## 2017-09-27 DIAGNOSIS — H2513 Age-related nuclear cataract, bilateral: Secondary | ICD-10-CM | POA: Diagnosis not present

## 2017-09-27 DIAGNOSIS — H47321 Drusen of optic disc, right eye: Secondary | ICD-10-CM | POA: Diagnosis not present

## 2017-09-27 DIAGNOSIS — Q142 Congenital malformation of optic disc: Secondary | ICD-10-CM | POA: Diagnosis not present

## 2018-01-23 DIAGNOSIS — E6609 Other obesity due to excess calories: Secondary | ICD-10-CM | POA: Diagnosis not present

## 2018-01-23 DIAGNOSIS — R220 Localized swelling, mass and lump, head: Secondary | ICD-10-CM | POA: Diagnosis not present

## 2018-01-23 DIAGNOSIS — Z23 Encounter for immunization: Secondary | ICD-10-CM | POA: Diagnosis not present

## 2018-01-23 DIAGNOSIS — N50811 Right testicular pain: Secondary | ICD-10-CM | POA: Diagnosis not present

## 2018-01-23 DIAGNOSIS — B351 Tinea unguium: Secondary | ICD-10-CM | POA: Diagnosis not present

## 2018-01-26 DIAGNOSIS — Z833 Family history of diabetes mellitus: Secondary | ICD-10-CM | POA: Diagnosis not present

## 2018-01-26 DIAGNOSIS — R220 Localized swelling, mass and lump, head: Secondary | ICD-10-CM | POA: Diagnosis not present

## 2018-01-26 DIAGNOSIS — Z800 Family history of malignant neoplasm of digestive organs: Secondary | ICD-10-CM | POA: Diagnosis not present

## 2018-01-26 DIAGNOSIS — Z125 Encounter for screening for malignant neoplasm of prostate: Secondary | ICD-10-CM | POA: Diagnosis not present

## 2018-01-26 DIAGNOSIS — E6609 Other obesity due to excess calories: Secondary | ICD-10-CM | POA: Diagnosis not present

## 2018-01-26 DIAGNOSIS — N529 Male erectile dysfunction, unspecified: Secondary | ICD-10-CM | POA: Diagnosis not present

## 2018-01-26 DIAGNOSIS — E781 Pure hyperglyceridemia: Secondary | ICD-10-CM | POA: Diagnosis not present

## 2018-02-01 ENCOUNTER — Ambulatory Visit (INDEPENDENT_AMBULATORY_CARE_PROVIDER_SITE_OTHER): Payer: Self-pay

## 2018-02-01 DIAGNOSIS — Z1211 Encounter for screening for malignant neoplasm of colon: Secondary | ICD-10-CM

## 2018-02-01 MED ORDER — NA SULFATE-K SULFATE-MG SULF 17.5-3.13-1.6 GM/177ML PO SOLN: 1.0000 | ORAL | 0 refills | Status: DC

## 2018-02-01 NOTE — Patient Instructions (Signed)
Chris Ramirez  April 25, 1950 MRN: [id number]     Procedure Date: 03/30/18 Time to register: 6:30am Place to register: [facility] Procedure Time: 7:30am Scheduled provider: R. Garfield Cornea, MD    PREPARATION FOR COLONOSCOPY WITH SUPREP BOWEL PREP KIT  Note: Suprep Bowel Prep Kit is a split-dose (2day) regimen. Consumption of BOTH 6-ounce bottles is required for a complete prep.  Please notify us immediately if you are diabetic, take iron supplements, or if you are on Coumadin or any other blood thinners.                                                                                                                                             2 DAYS BEFORE PROCEDURE:  DATE: 03/28/18   DAY: Wednesday Begin clear liquid diet AFTER your lunch meal. NO SOLID FOODS after this point.  1 DAY BEFORE PROCEDURE:  DATE: 03/29/18  DAY: Thursday Continue clear liquids the entire day - NO SOLID FOOD.    At 6:00pm: Complete steps 1 through 4 below, using ONE (1) 6-ounce bottle, before going to bed. Step 1:  Pour ONE (1) 6-ounce bottle of SUPREP liquid into the mixing container.  Step 2:  Add cool drinking water to the 16 ounce line on the container and mix.  Note: Dilute the solution concentrate as directed prior to use. Step 3:  DRINK ALL the liquid in the container. Step 4:  You MUST drink an additional two (2) or more 16 ounce containers of water over the next one (1) hour.   Continue clear liquids.  DAY OF PROCEDURE:   DATE: 03/30/18   DAY: Friday If you take medications for your heart, blood pressure, or breathing, you may take these medications.   5 hours before your procedure at :2:30am Step 1:  Pour ONE (1) 6-ounce bottle of SUPREP liquid into the mixing container.  Step 2:  Add cool drinking water to the 16 ounce line on the container and mix.  Note: Dilute the solution concentrate as directed prior to use. Step 3:  DRINK ALL the liquid in the container. Step 4:  You MUST  drink an additional two (2) or more 16 ounce containers of water over the next one (1) hour. You MUST complete the final glass of water at least 3 hours before your colonoscopy.  Nothing by mouth past 4:30am  You may take your morning medications with sip of water unless we have instructed otherwise.    Please see below for Dietary Information.  CLEAR LIQUIDS INCLUDE:  Water Jello (NOT red in color)   Ice Popsicles (NOT red in color)   Tea (sugar ok, no milk/cream) Powdered fruit flavored drinks  Coffee (sugar ok, no milk/cream) Gatorade/ Lemonade/ Kool-Aid  (NOT red in color)   Juice: apple, white grape, white cranberry Soft drinks  Clear bullion, consomme, broth (fat free  beef/chicken/vegetable)  Carbonated beverages (any kind)  Strained chicken noodle soup Hard Candy   Remember: Clear liquids are liquids that will allow you to see your fingers on the other side of a clear glass. Be sure liquids are NOT red in color, and not cloudy, but CLEAR.  DO NOT EAT OR DRINK ANY OF THE FOLLOWING:  Dairy products of any kind   Cranberry juice Tomato juice / V8 juice   Grapefruit juice Orange juice     Red grape juice  Do not eat any solid foods, including such foods as: cereal, oatmeal, yogurt, fruits, vegetables, creamed soups, eggs, bread, crackers, pureed foods in a blender, etc.   HELPFUL HINTS FOR DRINKING PREP SOLUTION:   Make sure prep is extremely cold. Mix and refrigerate the the morning of the prep. You may also put in the freezer.   You may try mixing some Crystal Light or Country Time Lemonade if you prefer. Mix in small amounts; add more if necessary.  Try drinking through a straw  Rinse mouth with water or a mouthwash between glasses, to remove after-taste.  Try sipping on a cold beverage /ice/ popsicles between glasses of prep.  Place a piece of sugar-free hard candy in mouth between glasses.  If you become nauseated, try consuming smaller amounts, or stretch out the  time between glasses. Stop for 30-60 minutes, then slowly start back drinking.     OTHER INSTRUCTIONS  You will need a responsible adult at least 66 years of age to accompany you and drive you home. This person must remain in the waiting room during your procedure. The hospital will cancel your procedure if you do not have a responsible adult with you.   1. Wear loose fitting clothing that is easily removed. 2. Leave jewelry and other valuables at home.  3. Remove all body piercing jewelry and leave at home. 4. Total time from sign-in until discharge is approximately 2-3 hours. 5. You should go home directly after your procedure and rest. You can resume normal activities the day after your procedure. 6. The day of your procedure you should not:  Drive  Make legal decisions  Operate machinery  Drink alcohol  Return to work   You may call the office (Dept: [phone]) before 5:00pm, or page the doctor on call [phone]) after 5:00pm, for further instructions, if necessary.   Insurance Information YOU WILL NEED TO CHECK WITH YOUR INSURANCE COMPANY FOR THE BENEFITS OF COVERAGE YOU HAVE FOR THIS PROCEDURE.  UNFORTUNATELY, NOT ALL INSURANCE COMPANIES HAVE BENEFITS TO COVER ALL OR PART OF THESE TYPES OF PROCEDURES.  IT IS YOUR RESPONSIBILITY TO CHECK YOUR BENEFITS, HOWEVER, WE WILL BE GLAD TO ASSIST YOU WITH ANY CODES YOUR INSURANCE COMPANY MAY NEED.    PLEASE NOTE THAT MOST INSURANCE COMPANIES WILL NOT COVER A SCREENING COLONOSCOPY FOR PEOPLE UNDER THE AGE OF 50  IF YOU HAVE BCBS INSURANCE, YOU MAY HAVE BENEFITS FOR A SCREENING COLONOSCOPY BUT IF POLYPS ARE FOUND THE DIAGNOSIS WILL CHANGE AND THEN YOU MAY HAVE A DEDUCTIBLE THAT WILL NEED TO BE MET. SO PLEASE MAKE SURE YOU CHECK YOUR BENEFITS FOR A SCREENING COLONOSCOPY AS WELL AS A DIAGNOSTIC COLONOSCOPY.

## 2018-02-01 NOTE — Progress Notes (Signed)
Gastroenterology Pre-Procedure Review  Request Date:02/01/18 Requesting Physician: 5 years recall- last tcs 07/16/12 RMR no polyps, diverticulosis +[facility]  PATIENT REVIEW QUESTIONS: The patient responded to the following health history questions as indicated:    1. Diabetes Melitis: no 2. Joint replacements in the past 12 months: no 3. Major health problems in the past 3 months: no 4. Has an artificial valve or MVP: no 5. Has a defibrillator: no 6. Has been advised in past to take antibiotics in advance of a procedure like teeth cleaning: no 7. Family history of colon cancer: yes (dad)  44. Alcohol Use: yes (occasionally) 9. History of sleep apnea: no  10. History of coronary artery or other vascular stents placed within the last 12 months: no 11. History of any prior anesthesia complications: no    MEDICATIONS & ALLERGIES:    Patient reports the following regarding taking any blood thinners:   Plavix? no Aspirin? no Coumadin? no Brilinta? no Xarelto? no Eliquis? no Pradaxa? no Savaysa? no Effient? no  Patient confirms/reports the following medications:  Current Outpatient Medications  Medication Sig Dispense Refill  . Carboxymethylcellulose Sodium (THERATEARS) 0.25 % SOLN Apply 1 drop to eye daily as needed.    Marland Kitchen glucosamine-chondroitin 500-400 MG tablet Take 1 tablet by mouth 3 (three) times daily.     No current facility-administered medications for this visit.     Patient confirms/reports the following allergies:  Allergies  Allergen Reactions  . Ibuprofen Anaphylaxis, Itching and Rash  . Penicillins Itching    No orders of the defined types were placed in this encounter.   AUTHORIZATION INFORMATION Primary Insurance: medicare,  ID #: [id number] Pre-Cert / Auth required: no  SCHEDULE INFORMATION: Procedure has been scheduled as follows:  Date: 03/30/18, Time: 7:30  Location: [facility] Dr.Rourk  This Gastroenterology Pre-Precedure Review Form is being routed to  the following provider(s): Neil Crouch, PA

## 2018-02-08 NOTE — Progress Notes (Signed)
Ok to schedule.

## 2018-03-30 ENCOUNTER — Encounter (HOSPITAL_COMMUNITY)
Admission: RE | Disposition: A | Discharge status: Home / Self Care | Payer: Self-pay | Source: Home / Self Care | Attending: Internal Medicine

## 2018-03-30 ENCOUNTER — Other Ambulatory Visit: Payer: Self-pay

## 2018-03-30 ENCOUNTER — Ambulatory Visit (HOSPITAL_COMMUNITY)
Admission: RE | Admit: 2018-03-30 | Discharge: 2018-03-30 | Disposition: A | Discharge status: Home / Self Care | Payer: Medicare Other | Attending: Internal Medicine | Admitting: Internal Medicine

## 2018-03-30 ENCOUNTER — Encounter (HOSPITAL_COMMUNITY): Payer: Self-pay | Admitting: *Deleted

## 2018-03-30 DIAGNOSIS — Z791 Long term (current) use of non-steroidal anti-inflammatories (NSAID): Secondary | ICD-10-CM | POA: Insufficient documentation

## 2018-03-30 DIAGNOSIS — Z1211 Encounter for screening for malignant neoplasm of colon: Secondary | ICD-10-CM | POA: Insufficient documentation

## 2018-03-30 DIAGNOSIS — Z79899 Other long term (current) drug therapy: Secondary | ICD-10-CM | POA: Diagnosis not present

## 2018-03-30 DIAGNOSIS — K5730 Diverticulosis of large intestine without perforation or abscess without bleeding: Secondary | ICD-10-CM

## 2018-03-30 DIAGNOSIS — E785 Hyperlipidemia, unspecified: Secondary | ICD-10-CM | POA: Diagnosis not present

## 2018-03-30 DIAGNOSIS — Z800 Family history of malignant neoplasm of digestive organs: Secondary | ICD-10-CM

## 2018-03-30 HISTORY — PX: COLONOSCOPY: SHX5424

## 2018-03-30 SURGERY — COLONOSCOPY
Anesthesia: Moderate Sedation

## 2018-03-30 MED ORDER — SODIUM CHLORIDE 0.9 % IV SOLN
INTRAVENOUS | Status: DC
  Administered 2018-03-30: 07:00:00 via INTRAVENOUS

## 2018-03-30 MED ORDER — MIDAZOLAM HCL 5 MG/5ML IJ SOLN
INTRAMUSCULAR | Status: DC | PRN
  Administered 2018-03-30 (×3): 1 mg via INTRAVENOUS
  Administered 2018-03-30: 2 mg via INTRAVENOUS

## 2018-03-30 MED ORDER — STERILE WATER FOR IRRIGATION IR SOLN
Status: DC | PRN
  Administered 2018-03-30: 08:00:00

## 2018-03-30 MED ORDER — ONDANSETRON HCL 4 MG/2ML IJ SOLN
INTRAMUSCULAR | Status: DC | PRN
  Administered 2018-03-30: 4 mg via INTRAVENOUS

## 2018-03-30 MED ORDER — MIDAZOLAM HCL 5 MG/5ML IJ SOLN
INTRAMUSCULAR | Status: AC
  Filled 2018-03-30: qty 10

## 2018-03-30 MED ORDER — MEPERIDINE HCL 100 MG/ML IJ SOLN
INTRAMUSCULAR | Status: DC | PRN
  Administered 2018-03-30: 25 mg
  Administered 2018-03-30: 15 mg via INTRAVENOUS

## 2018-03-30 MED ORDER — ONDANSETRON HCL 4 MG/2ML IJ SOLN
INTRAMUSCULAR | Status: AC
  Filled 2018-03-30: qty 2

## 2018-03-30 MED ORDER — MEPERIDINE HCL 50 MG/ML IJ SOLN
INTRAMUSCULAR | Status: AC
  Filled 2018-03-30: qty 1

## 2018-03-30 NOTE — Op Note (Signed)
[facility] Patient Name: Chris Ramirez Procedure Date: 03/30/2018 7:17 AM MRN: [id number] Date of Birth: Sep 01, 1950 Attending MD: Norvel Richards , MD CSN: [id number] Age: 67 Admit Type: Outpatient Procedure:                Colonoscopy Indications:              Screening in patient at increased risk: Family                            history of 1st-degree relative with colorectal                            cancer Providers:                Norvel Richards, MD, Janeece Riggers, RN, Randa Spike, Technician Referring MD:              Medicines:                Midazolam 5 mg IV, Meperidine 40 mg IV Complications:            No immediate complications. Estimated Blood Loss:     Estimated blood loss: none. Estimated blood loss:                            none. Procedure:                Pre-Anesthesia Assessment:                           - Prior to the procedure, a History and Physical                            was performed, and patient medications and                            allergies were reviewed. The patient's tolerance of                            previous anesthesia was also reviewed. The risks                            and benefits of the procedure and the sedation                            options and risks were discussed with the patient.                            All questions were answered, and informed consent                            was obtained. Prior Anticoagulants: The patient has                            taken  no previous anticoagulant or antiplatelet                            agents. ASA Grade Assessment: II - A patient with                            mild systemic disease. After reviewing the risks                            and benefits, the patient was deemed in                            satisfactory condition to undergo the procedure.                           After obtaining informed consent, the colonoscope                             was passed under direct vision. Throughout the                            procedure, the patient's blood pressure, pulse, and                            oxygen saturations were monitored continuously. The                            CF-HQ190L ([id number]) scope was introduced through                            the anus and advanced to the the cecum, identified                            by appendiceal orifice and ileocecal valve. Scope In: 7:45:14 AM Scope Out: 8:05:42 AM Scope Withdrawal Time: 0 hours 13 minutes 21 seconds  Total Procedure Duration: 0 hours 20 minutes 28 seconds  Findings:      Scattered medium-mouthed diverticula were found in the sigmoid colon and       descending colon.      The exam was otherwise without abnormality on direct and retroflexion       views. Impression:               - Diverticulosis in the sigmoid colon and in the                            descending colon.                           - The examination was otherwise normal on direct                            and retroflexion views.                           - No specimens collected. Moderate  Sedation:      Moderate (conscious) sedation was administered by the endoscopy nurse       and supervised by the endoscopist. The following parameters were       monitored: oxygen saturation, heart rate, blood pressure, respiratory       rate, EKG, adequacy of pulmonary ventilation, and response to care.       Total physician intraservice time was 25 minutes. Recommendation:           - Written discharge instructions were provided to                            the patient.                           - The signs and symptoms of potential delayed                            complications were discussed with the patient.                           - Patient has a contact number available for                            emergencies.                           - Return to normal activities  tomorrow.                           - Advance diet as tolerated.                           - Repeat colonoscopy in 5 years for screening                            purposes.                           - Return to GI office PRN. Procedure Code(s):        --- Professional ---                           [phone], Colonoscopy, flexible; diagnostic, including                            collection of specimen(s) by brushing or washing,                            when performed (separate procedure)                           99153, Moderate sedation; each additional 15                            minutes intraservice time  G0500, Moderate sedation services provided by the                            same physician or other qualified health care                            professional performing a gastrointestinal                            endoscopic service that sedation supports,                            requiring the presence of an independent trained                            observer to assist in the monitoring of the                            patient's level of consciousness and physiological                            status; initial 15 minutes of intra-service time;                            patient age 73 years or older (additional time may                            be reported with [phone], as appropriate) Diagnosis Code(s):        --- Professional ---                           Z80.0, Family history of malignant neoplasm of                            digestive organs                           K57.30, Diverticulosis of large intestine without                            perforation or abscess without bleeding CPT copyright 2018 American Medical Association. All rights reserved. The codes documented in this report are preliminary and upon coder review may  be revised to meet current compliance requirements. Chris Ramirez. Chris Corvin, MD Norvel Richards, MD 03/30/2018  8:20:58 AM This report has been signed electronically. Number of Addenda: 0

## 2018-03-30 NOTE — H&P (Signed)
@LOGO@   Primary Care Physician:  Knowlton, Steve, MD Primary Gastroenterologist:  Dr. Rourk  Pre-Procedure History & Physical: HPI:  Chris Ramirez is a 67 y.o. male is here for a screening colonoscopy.   High screening examination.  Positive family history colon cancer first-degree relative.  Past Medical History:  Diagnosis Date  . Hyperlipidemia    Managed by diet    Past Surgical History:  Procedure Laterality Date  . COLONOSCOPY  04/26/06   Anal paplla and 2 diminutive rectal polyps(Hyperplastic) other wise normal  . COLONOSCOPY N/A 07/16/2012   Procedure: COLONOSCOPY;  Surgeon: Robert M Rourk, MD;  Location[facility];  Service: Endoscopy;  Laterality: N/A;  8:30-moved to 10:30 Leigh Ann to notify pt  . INGUINAL HERNIA REPAIR    . INGUINAL HERNIA REPAIR Left 01/21/2015   Procedure: LAPAROSCOPIC LEFT INGUINAL HERNIA REPAIR WITH MESH;  Surgeon: Douglas Blackman, MD;  Location: [facility];  Service: General;  Laterality: Left;  . INSERTION OF MESH Left 01/21/2015   Procedure: INSERTION OF MESH;  Surgeon: Douglas Blackman, MD;  Location: [facility];  Service: General;  Laterality: Left;    Prior to Admission medications   Medication Sig Start Date End Date Taking? Authorizing Provider  glucosamine-chondroitin 500-400 MG tablet Take 2 tablets by mouth daily.    Yes [provider]  hydrocortisone cream 1 % Apply 1 application topically daily as needed for itching.   Yes [provider]  Na Sulfate-K Sulfate-Mg Sulf (SUPREP BOWEL PREP KIT) 17.5-3.13-1.6 GM/177ML SOLN Take 1 kit by mouth as directed. 02/01/18  Yes Lewis, Leslie S, PA-C    Allergies as of 02/01/2018 - Review Complete 02/01/2018  Allergen Reaction Noted  . Ibuprofen Anaphylaxis, Itching, and Rash 07/05/2012  . Penicillins Itching 07/05/2012    Family History  Problem Relation Age of Onset  . Colon cancer Father        in his 70s  . Heart attack Father   .  Heart failure Mother     Social History   Socioeconomic History  . Marital status: Married    Spouse name: Not on file  . Number of children: Not on file  . Years of education: Not on file  . Highest education level: Not on file  Occupational History  . Occupation: Firefighter    Employer: SELF EMPLOYED  . Occupation: HVAC  Social Needs  . Financial resource strain: Not on file  . Food insecurity:    Worry: Not on file    Inability: Not on file  . Transportation needs:    Medical: Not on file    Non-medical: Not on file  Tobacco Use  . Smoking status: Never Smoker  . Smokeless tobacco: Never Used  Substance and Sexual Activity  . Alcohol use: Yes    Alcohol/week: 1.0 standard drinks    Types: 1 Glasses of wine per week  . Drug use: No  . Sexual activity: Not on file  Lifestyle  . Physical activity:    Days per week: Not on file    Minutes per session: Not on file  . Stress: Not on file  Relationships  . Social connections:    Talks on phone: Not on file    Gets together: Not on file    Attends religious service: Not on file    Active member of club or organization: Not on file    Attends meetings of clubs or organizations: Not on file      Relationship status: Not on file  . Intimate partner violence:    Fear of current or ex partner: Not on file    Emotionally abused: Not on file    Physically abused: Not on file    Forced sexual activity: Not on file  Other Topics Concern  . Not on file  Social History Narrative   Firefighter, volunteer, former chief.   Heating and Air service    Review of Systems: See HPI, otherwise negative ROS  Physical Exam: BP 118/72   Pulse 78   Temp 98.6 F (37 C) (Oral)   Resp 14   Ht 5' 10" (1.778 m)   Wt 99.8 kg   SpO2 94%   BMI 31.57 kg/m  General:   Alert,  Well-developed, well-nourished, pleasant and cooperative in NAD Head:  Normocephalic and atraumatic. Lungs:  Clear throughout to auscultation.   No wheezes,  crackles, or rhonchi. No acute distress. Heart:  Regular rate and rhythm; no murmurs, clicks, rubs,  or gallops. Abdomen:  Soft, nontender and nondistended. No masses, hepatosplenomegaly or hernias noted. Normal bowel sounds, without guarding, and without rebound.    Impression/Plan: Chris Ramirez is now here to undergo a screening colonoscopy.   Highscreening examination. Risks, benefits, limitations, imponderables and alternatives regarding colonoscopy have been reviewed with the patient. Questions have been answered. All parties agreeable.     Notice:  This dictation was prepared with Dragon dictation along with smaller phrase technology. Any transcriptional errors that result from this process are unintentional and may not be corrected upon review.  

## 2018-03-30 NOTE — Discharge Instructions (Signed)
Colonoscopy Discharge Instructions  Read the instructions outlined below and refer to this sheet in the next few weeks. These discharge instructions provide you with general information on caring for yourself after you leave the hospital. Your doctor may also give you specific instructions. While your treatment has been planned according to the most current medical practices available, unavoidable complications occasionally occur. If you have any problems or questions after discharge, call Dr. Gala Ramirez at [phone]. ACTIVITY  You may resume your regular activity, but move at a slower pace for the next 24 hours.   Take frequent rest periods for the next 24 hours.   Walking will help get rid of the air and reduce the bloated feeling in your belly (abdomen).   No driving for 24 hours (because of the medicine (anesthesia) used during the test).    Do not sign any important legal documents or operate any machinery for 24 hours (because of the anesthesia used during the test).  NUTRITION  Drink plenty of fluids.   You may resume your normal diet as instructed by your doctor.   Begin with a light meal and progress to your normal diet. Heavy or fried foods are harder to digest and may make you feel sick to your stomach (nauseated).   Avoid alcoholic beverages for 24 hours or as instructed.  MEDICATIONS  You may resume your normal medications unless your doctor tells you otherwise.  WHAT YOU CAN EXPECT TODAY  Some feelings of bloating in the abdomen.   Passage of more gas than usual.   Spotting of blood in your stool or on the toilet paper.  IF YOU HAD POLYPS REMOVED DURING THE COLONOSCOPY:  No aspirin products for 7 days or as instructed.   No alcohol for 7 days or as instructed.   Eat a soft diet for the next 24 hours.  FINDING OUT THE RESULTS OF YOUR TEST Not all test results are available during your visit. If your test results are not back during the visit, make an appointment  with your caregiver to find out the results. Do not assume everything is normal if you have not heard from your caregiver or the medical facility. It is important for you to follow up on all of your test results.  SEEK IMMEDIATE MEDICAL ATTENTION IF:  You have more than a spotting of blood in your stool.   Your belly is swollen (abdominal distention).   You are nauseated or vomiting.   You have a temperature over 101.   You have abdominal pain or discomfort that is severe or gets worse throughout the day.    Diverticulosis information provided  Repeat screening colonoscopy in 5 years   Diverticulosis Diverticulosis is a condition that develops when small pouches (diverticula) form in the wall of the large intestine (colon). The colon is where water is absorbed and stool is formed. The pouches form when the inside layer of the colon pushes through weak spots in the outer layers of the colon. You may have a few pouches or many of them. What are the causes? The cause of this condition is not known. What increases the risk? The following factors may make you more likely to develop this condition:  Being older than age 80. Your risk for this condition increases with age. Diverticulosis is rare among people younger than age 99. By age 49, many people have it.  Eating a low-fiber diet.  Having frequent constipation.  Being overweight.  Not getting enough exercise.  Smoking.  Taking over-the-counter pain medicines, like aspirin and ibuprofen.  Having a family history of diverticulosis.  What are the signs or symptoms? In most people, there are no symptoms of this condition. If you do have symptoms, they may include:  Bloating.  Cramps in the abdomen.  Constipation or diarrhea.  Pain in the lower left side of the abdomen.  How is this diagnosed? This condition is most often diagnosed during an exam for other colon problems. Because diverticulosis usually has no symptoms,  it often cannot be diagnosed independently. This condition may be diagnosed by:  Using a flexible scope to examine the colon (colonoscopy).  Taking an X-ray of the colon after dye has been put into the colon (barium enema).  Doing a CT scan.  How is this treated? You may not need treatment for this condition if you have never developed an infection related to diverticulosis. If you have had an infection before, treatment may include:  Eating a high-fiber diet. This may include eating more fruits, vegetables, and grains.  Taking a fiber supplement.  Taking a live bacteria supplement (probiotic).  Taking medicine to relax your colon.  Taking antibiotic medicines.  Follow these instructions at home:  Drink 6-8 glasses of water or more each day to prevent constipation.  Try not to strain when you have a bowel movement.  If you have had an infection before: ? Eat more fiber as directed by your health care provider or your diet and nutrition specialist (dietitian). ? Take a fiber supplement or probiotic, if your health care provider approves.  Take over-the-counter and prescription medicines only as told by your health care provider.  If you were prescribed an antibiotic, take it as told by your health care provider. Do not stop taking the antibiotic even if you start to feel better.  Keep all follow-up visits as told by your health care provider. This is important. Contact a health care provider if:  You have pain in your abdomen.  You have bloating.  You have cramps.  You have not had a bowel movement in 3 days. Get help right away if:  Your pain gets worse.  Your bloating becomes very bad.  You have a fever or chills, and your symptoms suddenly get worse.  You vomit.  You have bowel movements that are bloody or black.  You have bleeding from your rectum. Summary  Diverticulosis is a condition that develops when small pouches (diverticula) form in the wall of  the large intestine (colon).  You may have a few pouches or many of them.  This condition is most often diagnosed during an exam for other colon problems.  If you have had an infection related to diverticulosis, treatment may include increasing the fiber in your diet, taking supplements, or taking medicines. This information is not intended to replace advice given to you by your health care provider. Make sure you discuss any questions you have with your health care provider. Document Released: 12/31/2003 Document Revised: 02/22/2016 Document Reviewed: 02/22/2016 Elsevier Interactive Patient Education  2017 [employer].

## 2018-04-04 ENCOUNTER — Encounter (HOSPITAL_COMMUNITY): Payer: Self-pay | Admitting: Internal Medicine

## 2018-10-18 ENCOUNTER — Other Ambulatory Visit: Payer: Medicare Other

## 2018-10-18 ENCOUNTER — Other Ambulatory Visit: Payer: Self-pay

## 2018-10-18 DIAGNOSIS — Z20822 Contact with and (suspected) exposure to covid-19: Secondary | ICD-10-CM

## 2018-10-24 LAB — NOVEL CORONAVIRUS, NAA: SARS-CoV-2, NAA: NOT DETECTED

## 2019-02-26 DIAGNOSIS — S62609B Fracture of unspecified phalanx of unspecified finger, initial encounter for open fracture: Secondary | ICD-10-CM | POA: Insufficient documentation

## 2019-02-26 DIAGNOSIS — Y929 Unspecified place or not applicable: Secondary | ICD-10-CM | POA: Insufficient documentation

## 2019-02-26 DIAGNOSIS — Z79899 Other long term (current) drug therapy: Secondary | ICD-10-CM | POA: Insufficient documentation

## 2019-02-26 DIAGNOSIS — S6992XA Unspecified injury of left wrist, hand and finger(s), initial encounter: Secondary | ICD-10-CM | POA: Diagnosis present

## 2019-02-26 DIAGNOSIS — Y9389 Activity, other specified: Secondary | ICD-10-CM | POA: Diagnosis not present

## 2019-02-26 DIAGNOSIS — W320XXA Accidental handgun discharge, initial encounter: Secondary | ICD-10-CM | POA: Diagnosis not present

## 2019-02-26 DIAGNOSIS — Y999 Unspecified external cause status: Secondary | ICD-10-CM | POA: Diagnosis not present

## 2019-02-26 DIAGNOSIS — Z23 Encounter for immunization: Secondary | ICD-10-CM | POA: Insufficient documentation

## 2019-02-27 ENCOUNTER — Emergency Department (HOSPITAL_COMMUNITY)
Admission: EM | Admit: 2019-02-27 | Discharge: 2019-02-27 | Disposition: A | Discharge status: Home / Self Care | Payer: Medicare Other | Attending: Emergency Medicine | Admitting: Emergency Medicine

## 2019-02-27 ENCOUNTER — Encounter (HOSPITAL_COMMUNITY): Payer: Self-pay | Admitting: Emergency Medicine

## 2019-02-27 ENCOUNTER — Emergency Department (HOSPITAL_COMMUNITY): Payer: Medicare Other

## 2019-02-27 ENCOUNTER — Other Ambulatory Visit: Payer: Self-pay

## 2019-02-27 DIAGNOSIS — S62609B Fracture of unspecified phalanx of unspecified finger, initial encounter for open fracture: Secondary | ICD-10-CM | POA: Diagnosis not present

## 2019-02-27 DIAGNOSIS — M79642 Pain in left hand: Secondary | ICD-10-CM | POA: Insufficient documentation

## 2019-02-27 MED ORDER — SODIUM CHLORIDE 0.9 % IV SOLN
1.0000 g | Freq: Once | INTRAVENOUS | Status: AC
  Administered 2019-02-27: 1 g via INTRAVENOUS

## 2019-02-27 MED ORDER — TETANUS-DIPHTH-ACELL PERTUSSIS 5-2.5-18.5 LF-MCG/0.5 IM SUSP
0.5000 mL | Freq: Once | INTRAMUSCULAR | Status: AC
  Administered 2019-02-27: 0.5 mL via INTRAMUSCULAR
  Filled 2019-02-27: qty 0.5

## 2019-02-27 MED ORDER — CEFTRIAXONE SODIUM 1 G IJ SOLR: 1.0000 g | Freq: Once | INTRAMUSCULAR | Status: DC

## 2019-02-27 MED ORDER — CEPHALEXIN 500 MG PO CAPS: 500.0000 mg | ORAL_CAPSULE | Freq: Four times a day (QID) | ORAL | 0 refills | Status: AC

## 2019-02-27 MED ORDER — OXYCODONE-ACETAMINOPHEN 5-325 MG PO TABS
1.0000 | ORAL_TABLET | Freq: Once | ORAL | Status: AC
  Administered 2019-02-27: 1 via ORAL
  Filled 2019-02-27: qty 1

## 2019-02-27 MED ORDER — OXYCODONE-ACETAMINOPHEN 5-325 MG PO TABS: 2.0000 | ORAL_TABLET | ORAL | 0 refills | Status: DC | PRN

## 2019-02-27 MED ORDER — TRANEXAMIC ACID 1000 MG/10ML IV SOLN
500.0000 mg | Freq: Once | INTRAVENOUS | Status: DC
  Filled 2019-02-27: qty 10

## 2019-02-27 MED ORDER — FENTANYL CITRATE (PF) 100 MCG/2ML IJ SOLN
50.0000 ug | Freq: Once | INTRAMUSCULAR | Status: AC
  Administered 2019-02-27: 50 ug via INTRAVENOUS
  Filled 2019-02-27: qty 2
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
## 2019-02-28 MED FILL — Oxycodone w/ Acetaminophen Tab 5-325 MG: ORAL | Qty: 6 | Status: AC

## 2019-03-13 DIAGNOSIS — W3400XA Accidental discharge from unspecified firearms or gun, initial encounter: Secondary | ICD-10-CM | POA: Insufficient documentation

## 2020-01-14 ENCOUNTER — Other Ambulatory Visit: Payer: Self-pay | Admitting: Surgery
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
## 2021-03-01 ENCOUNTER — Ambulatory Visit (INDEPENDENT_AMBULATORY_CARE_PROVIDER_SITE_OTHER): Payer: 59 | Admitting: Urology

## 2021-03-01 ENCOUNTER — Encounter: Payer: Self-pay | Admitting: Urology

## 2021-03-01 ENCOUNTER — Other Ambulatory Visit: Payer: Self-pay

## 2021-03-01 VITALS — BP 148/93 | HR 66 | Temp 98.2°F

## 2021-03-01 DIAGNOSIS — R351 Nocturia: Secondary | ICD-10-CM | POA: Diagnosis not present

## 2021-03-01 NOTE — Progress Notes (Signed)
Assessment: 1. Nocturia     Plan: I discussed options for management of his mild urinary symptoms.  He is not interested in medical therapy at this time.  I advised him that I cannot explain the symptoms he is experiencing following ejaculation.  Unfortunately, I do not have any recommendations for management. Request PSA results from [place] Return to office prn  Chief Complaint:  Chief Complaint  Patient presents with   Nocturia    History of Present Illness:  Chris Ramirez is a 70 y.o. year old male who is seen in consultation from Caren Macadam, MD or evaluation of lower urinary tract symptoms.  He reports a 1 year history of decreased force of stream at night.  He has nocturia x1.  He does feel like he empties his bladder completely.  He does not have any daytime symptoms.  No dysuria or gross hematuria.  No prior medical therapy for his symptoms. AUA score = 4 today.  He also reports uncontrollable yawning and sinus drainage after ejaculation.  He is able to achieve a adequate erection and ejaculate without difficulty.  No pain with ejaculation.  No decrease in his libido.   Past Medical History:  Past Medical History:  Diagnosis Date   Hyperlipidemia    Managed by diet    Past Surgical History:  Past Surgical History:  Procedure Laterality Date   COLONOSCOPY  04/26/06   Anal paplla and 2 diminutive rectal polyps(Hyperplastic) other wise normal   COLONOSCOPY N/A 07/16/2012   Procedure: COLONOSCOPY;  Surgeon: Daneil Dolin, MD;  [facility];  Service: Endoscopy;  Laterality: N/A;  8:30-moved to 10:30 Darius Bump to notify pt   COLONOSCOPY N/A 03/30/2018   Procedure: COLONOSCOPY;  Surgeon: Daneil Dolin, MD;  [facility];  Service: Endoscopy;  Laterality: N/A;  7:30   INGUINAL HERNIA REPAIR     INGUINAL HERNIA REPAIR Left 01/21/2015   Procedure: LAPAROSCOPIC LEFT INGUINAL HERNIA REPAIR WITH MESH;  Surgeon: Coralie Keens, MD;   Location: [facility];  Service: General;  Laterality: Left;   INSERTION OF MESH Left 01/21/2015   Procedure: INSERTION OF MESH;  Surgeon: Coralie Keens, MD;  Location: [facility];  Service: General;  Laterality: Left;    Allergies:  Allergies  Allergen Reactions   Ibuprofen Anaphylaxis, Itching and Rash   Penicillins Itching and Other (See Comments)    Has patient had a PCN reaction causing immediate rash, facial/tongue/throat swelling, SOB or lightheadedness with hypotension: No Has patient had a PCN reaction causing severe rash involving mucus membranes or skin necrosis: No Has patient had a PCN reaction that required hospitalization: No Has patient had a PCN reaction occurring within the last 10 years: No If all of the above answers are "NO", then may proceed with Cephalosporin use.     Family History:  Family History  Problem Relation Age of Onset   Colon cancer Father        in his 77s   Heart attack Father    Heart failure Mother     Social History:  Social History   Tobacco Use   Smoking status: Never   Smokeless tobacco: Never  Vaping Use   Vaping Use: Never used  Substance Use Topics   Alcohol use: Yes    Alcohol/week: 1.0 standard drink    Types: 1 Glasses of wine per week   Drug use: No    Review of symptoms:  Constitutional:  Negative for unexplained weight loss, night sweats, fever, chills ENT:  Negative for nose bleeds, sinus pain, painful swallowing CV:  Negative for chest pain, shortness of breath, exercise intolerance, palpitations, loss of consciousness Resp:  Negative for cough, wheezing, shortness of breath GI:  Negative for nausea, vomiting, diarrhea, bloody stools GU:  Positives noted in HPI; otherwise negative for gross hematuria, dysuria, urinary incontinence Neuro:  Negative for seizures, poor balance, limb weakness, slurred speech Psych:  Negative for lack of energy, depression, anxiety Endocrine:  Negative  for polydipsia, polyuria, symptoms of hypoglycemia (dizziness, hunger, sweating) Hematologic:  Negative for anemia, purpura, petechia, prolonged or excessive bleeding, use of anticoagulants  Allergic:  Negative for difficulty breathing or choking as a result of exposure to anything; no shellfish allergy; no allergic response (rash/itch) to materials, foods  Physical exam: BP (!) 148/93   Pulse 66   Temp 98.2 F (36.8 C)  GENERAL APPEARANCE:  Well appearing, well developed, well nourished, NAD HEENT: Atraumatic, Normocephalic, oropharynx clear. NECK: Supple without lymphadenopathy or thyromegaly. LUNGS: Clear to auscultation bilaterally. HEART: Regular Rate and Rhythm without murmurs, gallops, or rubs. ABDOMEN: Soft, non-tender, No Masses. EXTREMITIES: Moves all extremities well.  Without clubbing, cyanosis, or edema. NEUROLOGIC:  Alert and oriented x 3, normal gait, CN II-XII grossly intact.  MENTAL STATUS:  Appropriate. BACK:  Non-tender to palpation.  No CVAT SKIN:  Warm, dry and intact.  GU: Penis:  circumcised Meatus: Normal Scrotum: normal, no masses Testis: normal without masses bilateral Prostate: 40 g, NT, no nodules Rectum: Normal tone,  no masses or tenderness    Results: U/A dipstick: negative  PVR:  0 ml

## 2021-03-01 NOTE — Progress Notes (Signed)
Urological Symptom Review  Patient is experiencing the following symptoms: Get up at night to urinate Weak stream   Review of Systems  Gastrointestinal (upper)  : Negative for upper GI symptoms  Gastrointestinal (lower) : Negative for lower GI symptoms  Constitutional : Negative for symptoms  Skin: Negative for skin symptoms  Eyes: Negative for eye symptoms  Ear/Nose/Throat : Negative for Ear/Nose/Throat symptoms  Hematologic/Lymphatic: Negative for Hematologic/Lymphatic symptoms  Cardiovascular : Negative for cardiovascular symptoms  Respiratory : Negative for respiratory symptoms  Endocrine: Negative for endocrine symptoms  Musculoskeletal: Negative for musculoskeletal symptoms  Neurological: Negative for neurological symptoms  Psychologic: Negative for psychiatric symptoms  

## 2021-03-01 NOTE — Progress Notes (Signed)
post void residual = 0 ml

## 2022-07-12 ENCOUNTER — Other Ambulatory Visit: Payer: Self-pay | Admitting: Family Medicine

## 2022-07-12 DIAGNOSIS — E781 Pure hyperglyceridemia: Secondary | ICD-10-CM

## 2022-07-12 DIAGNOSIS — E786 Lipoprotein deficiency: Secondary | ICD-10-CM

## 2022-07-12 DIAGNOSIS — E669 Obesity, unspecified: Secondary | ICD-10-CM

## 2022-07-12 DIAGNOSIS — N529 Male erectile dysfunction, unspecified: Secondary | ICD-10-CM

## 2022-08-16 ENCOUNTER — Ambulatory Visit
Admission: RE | Admit: 2022-08-16 | Discharge: 2022-08-16 | Disposition: A | Discharge status: Home / Self Care | Payer: No Typology Code available for payment source | Source: Ambulatory Visit | Attending: Family Medicine | Admitting: Family Medicine

## 2022-08-16 DIAGNOSIS — N529 Male erectile dysfunction, unspecified: Secondary | ICD-10-CM

## 2022-08-16 DIAGNOSIS — E669 Obesity, unspecified: Secondary | ICD-10-CM

## 2022-08-16 DIAGNOSIS — E786 Lipoprotein deficiency: Secondary | ICD-10-CM

## 2022-08-16 DIAGNOSIS — E781 Pure hyperglyceridemia: Secondary | ICD-10-CM

## 2022-08-31 NOTE — Progress Notes (Signed)
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 He presents today to establish further surveillance of this ATAA. He denies chest pain, pressure, tightness, or LE edema.  Past Medical History:  Diagnosis Date   Hyperlipidemia    Managed by diet    Past Surgical History:  Procedure Laterality Date   COLONOSCOPY  04/26/06   Anal paplla and 2 diminutive rectal polyps(Hyperplastic) other wise normal   COLONOSCOPY N/A 07/16/2012   Procedure: COLONOSCOPY;  Surgeon: Corbin Ade, MD;  Location: [facility];  Service: Endoscopy;  Laterality: N/A;  8:30-moved to 10:30 [name] to notify pt   COLONOSCOPY N/A 03/30/2018   Procedure: COLONOSCOPY;  Surgeon: Corbin Ade, MD;  Location: [facility];  Service: Endoscopy;  Laterality: N/A;  7:30   INGUINAL HERNIA REPAIR     INGUINAL HERNIA REPAIR Left 01/21/2015   Procedure: LAPAROSCOPIC LEFT INGUINAL HERNIA REPAIR WITH MESH;  Surgeon: Abigail Miyamoto, MD;  Location: [facility];  Service: General;  Laterality: Left;   INSERTION OF MESH Left 01/21/2015   Procedure: INSERTION OF MESH;  Surgeon: Abigail Miyamoto, MD;  Location: [facility];  Service: General;  Laterality: Left;    Family History  Problem Relation Age of Onset   Colon cancer Father        in his 100s   Heart attack Father    Heart failure Mother     Social History Social History   Tobacco Use   Smoking status: Never   Smokeless tobacco: Never  Vaping Use   Vaping Use: Never used  Substance Use Topics   Alcohol use: Yes     Alcohol/week: 1.0 standard drink of alcohol    Types: 1 Glasses of wine per week   Drug use: No  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      hydrocortisone cream 1 % Apply 1 application topically daily as needed for itching.     Na Sulfate-K Sulfate-Mg Sulf (SUPREP BOWEL PREP KIT) 17.5-3.13-1.6 GM/177ML SOLN Take 1 kit by mouth as directed. 1 Bottle 0   oxyCODONE-acetaminophen (PERCOCET) 5-325 MG tablet Take 2 tablets by mouth every 4 (four) hours as needed. 20 tablet 0   oxyCODONE-acetaminophen (PERCOCET/ROXICET) 5-325 MG tablet Take 2 tablets by mouth every 4 (four) hours as needed for severe pain. 6 tablet 0   Allergies  Allergen Reactions   Ibuprofen Anaphylaxis, Itching and Rash   Penicillins Itching and Other (See Comments)    Has patient had a PCN reaction causing immediate rash, facial/tongue/throat swelling, SOB or lightheadedness with hypotension: No Has patient had a PCN reaction causing severe rash involving mucus membranes or skin necrosis: No Has patient had a PCN reaction that required hospitalization: No Has patient had a PCN reaction occurring within the last 10 years: No If all of the above answers are "  NO", then may proceed with Cephalosporin use.   Review of Systems  Chest Pain [ N ] Resting SOB Klaus.Mock ] Exertional SOB [ N ]  Pedal Edema Klaus.Mock  ]  General Review of Systems: [Y] = yes [ N]=no  Consitutional:   nausea Klaus.Mock ];  fever Klaus.Mock ];  Eye : Amaurosis fugax[ N ];  Resp: cough Klaus.Mock ];  hemoptysis[ N];  GI: vomiting[N ]; melena[ N]; hematochezia [N];  ZO:XWRUEAVWU[ N]; Heme/Lymph: anemia[N ];  Neuro: TIA[ N];stroke[N ];  seizures[ N];  Endocrine: diabetes[N ];   Vital Signs: Vitals:   09/06/22 1322  BP: 136/77  Pulse: 79  Resp: 20  SpO2: 94%     Physical Exam: CV-RRR, no murmur Neck-No carotid  bruit Pulmonary-Clear to auscultation bilaterally Abdomen-Soft, non tender, bowel sounds present Extremities-No LE edema Neurologic-Grossly intact without focal deficit  Diagnostic Tests: Narrative & Impression  CLINICAL DATA:  High cholesterol   * Tracking Code: FCC *   EXAM: CT CARDIAC CORONARY ARTERY CALCIUM SCORE   TECHNIQUE: Non-contrast imaging through the heart was performed using prospective ECG gating. Image post processing was performed on an independent workstation, allowing for quantitative analysis of the heart and coronary arteries. Note that this exam targets the heart and the chest was not imaged in its entirety.   COMPARISON:  None available.   FINDINGS: CORONARY CALCIUM SCORES:   Left Main: 73.8   LAD: 201   LCx: 69.9   RCA: 409   Total Agatston Score: 754   MESA database percentile: 79   AORTA MEASUREMENTS:   Ascending Aorta: 4.2 cm   Descending Aorta:2.7 cm   OTHER FINDINGS:   Heart is normal size. Aneurysmal dilatation of the aortic root at the sinuses of Valsalva measuring 4.2 cm. Moderate aortic atherosclerosis. No adenopathy. No confluent airspace opacities or effusions. No acute findings in the upper abdomen. Chest wall soft tissues are unremarkable. No acute bony abnormality.   IMPRESSION: Total Agatston score: 754   [place] database percentile: 79   4.2 cm ascending thoracic aortic aneurysm at the sinuses of Valsalva/aortic root. Recommend annual imaging followup by CTA or MRA. This recommendation follows 2010 ACCF/AHA/AATS/ACR/ASA/SCA/SCAI/SIR/STS/SVM Guidelines for the Diagnosis and Management of Patients with Thoracic Aortic Disease. Circulation. 2010; 121: J811-B147. Aortic aneurysm NOS (ICD10-I71.9)   Aortic atherosclerosis.     Electronically Signed   By: Charlett Nose M.D.   On: 08/16/2022 09:52      Impression and Plan: CT cardiac coronary calcium scan showed aneurysmal dilatation of the aortic root at the  sinuses of Valsalva measuring 4.2 cm.  No recent echocardiogram but he did have one done back in 2014 and showed no significant AI and AV is tricuspid. Also, the aortic root was mildly ectatic and the ascending aorta was mildly dilated . We discussed the natural history and and risk factors for growth of ascending aortic aneurysms.  We covered the importance of smoking cessation (does not apply as he does not smoke), tight blood pressure control (he states BP usually 120/80 or less;has may have white coat hypertension), refraining from lifting heavy objects, and avoiding fluoroquinolones.  The patient is aware of signs and symptoms of aortic dissection and when to present to the emergency department.  Of note, he is on Crestor 10 mg daily so have updated AVS risk modification to reflect this. We will continue surveillance and a repeat CTA was ordered for 1 year.     Ardelle Balls, PA-C [facility] (  [phone]  

## 2022-09-06 ENCOUNTER — Institutional Professional Consult (permissible substitution): Payer: 59 | Admitting: Physician Assistant

## 2022-09-06 VITALS — BP 136/77 | HR 79 | Resp 20 | Ht 70.0 in | Wt 238.0 lb

## 2022-09-06 DIAGNOSIS — I7121 Aneurysm of the ascending aorta, without rupture: Secondary | ICD-10-CM

## 2022-09-06 NOTE — Patient Instructions (Addendum)
Risk Modification in those with ascending thoracic aortic aneurysm:  Continue good control of blood pressure (prefer SBP 130/80 or less)-low salt, heart healthy diet is important  2. Avoid fluoroquinolone antibiotics (I.e Ciprofloxacin, Avelox, Levofloxacin, Ofloxacin)  3.  Use of statin (to decrease cardiovascular risk)-Previously, diet controlled hyperlipidemia. He had lipid  profile recently. Per updated paperwork, he is on Crestor.  4.  Exercise and activity limitations is individualized, but in general, contact sports are to be  avoided and one should avoid heavy lifting (defined as half of ideal body weight) and exercises involving sustained Valsalva maneuver.  5. Counseling for those suspected of having genetically mediated disease. First-degree relatives of those with TAA disease should be screened as well as those who have a connective tissue disease (I.e with Marfan syndrome, Ehlers-Danlos syndrome,  and Loeys-Dietz syndrome) or a  bicuspid aortic valve,have an increased risk for  complications related to TAA. Per patient, no family history of ATAA or connective tissue disease. He had an echo done in 2014 that showed no AI and aortic valve to be tricuspid.  6. He has no history of tobacco abuse    Aortic dissection happens when there is a tear in the wall of the body's main blood vessel (aorta). The aorta leads out of the heart (ascending aorta), curves around, and then goes down the chest (descending aorta) and into the abdomen to supply arteries with blood. The wall of the aorta has inner and outer layers. When aortic dissection occurs, blood collects along the tear, while one part of the aorta continues to carry blood to the body. Blood can also flow into the tear between the layers of the aorta. The torn part of the aorta fills with blood and swells. This can reduce blood flow through the part of the aorta that is still supplying blood to the body. Aortic dissection is a  medical emergency. What are the causes? This condition is commonly caused by weakening of the artery wall due to high blood pressure. Other causes may include: An injury, such as from a car crash. A complication from heart surgery or from a diagnostic procedure called coronary catheterization. Weakness of the artery wall due to birth defects or genetic problems that affect the connective tissues, such as Marfan syndrome. In some cases, the cause is not known. What increases the risk? The following factors may make you more likely to develop this condition: Having certain medical conditions, such as: High blood pressure (hypertension). Hardening and narrowing of the arteries (atherosclerosis). A condition that causes inflammation of blood vessels, such as giant cell arteritis. Having two cusps in the aortic valve instead of three (bicuspid aortic valve). Having a bulge in the wall of the aorta (aortic aneurysm). Being male. Being pregnant. Being older than age 85. Using stimulants such as cocaine or methamphetamine. Smoking. Lifting heavy weights or doing other types of strength training, also called high-intensity resistance training. What are the signs or symptoms? Signs and symptoms of aortic dissection start suddenly. The most common symptoms are: Severe, sharp chest pain that may feel like tearing or stabbing. Severe pain that spreads to the back, neck, jaw, abdomen, or down the legs. Severe pain between the shoulder blades in the back. Other symptoms may include: Trouble breathing. Dizziness or fainting. Sudden weakness on one side of the body. Nausea or vomiting. Trouble swallowing. Coughing up blood. Vomiting blood. Clammy skin. How is this diagnosed? This condition may be diagnosed based on: Your symptoms and a physical exam.  This may include: Listening for abnormal blood flow sounds (murmurs) in your chest or abdomen. Checking your pulse in your arms and  legs. Checking your blood pressure to see whether it is low, or whether there is a difference between the measurements from your right arm and left arm. Comparing your heart beat to your pulse in your arms and legs. Electrocardiogram (ECG). This test measures the electrical activity in your heart. Chest X-ray. CT scan. MRI. Echocardiogram. This uses sound waves to make images of your heart. Transesophageal echocardiography. This test takes images of your heart through the esophagus. Blood tests. How is this treated? It is important to treat aortic dissection as quickly as possible. Treatment may start as soon as your health care provider thinks that you have aortic dissection. Treatment depends on where the dissection is, how severe it is, and your overall health. Treatment may include: Medicines to lower your heart rate and blood pressure. Surgery to repair your aorta using artificial material (syntheticgraft). A procedure to insert a stent-graft into the aorta (endovascular procedure). During this procedure: A long, thin tube (stent) is inserted into an artery near the groin (femoral artery). The stent is moved up to the damaged part of the aorta. The stent is opened to help improve blood flow and prevent future dissection. Follow these instructions at home: If you had surgery, follow instructions from your health care provider about home care after the procedure. Activity You may have to avoid lifting. Ask your health care provider how much you can safely lift. Avoid activities that could injure your chest or abdomen. Ask your health care provider what activities are safe for you. After you have recovered, try to stay active. Ask your health care provider what activities are safe for you after recovery. Enroll in cardiac rehabilitation. This is a program that helps to improve your health and well-being. It includes exercise training, education, and counseling to help you  recover. Lifestyle     Eat a heart-healthy diet, which includes lots of fresh fruits and vegetables, low-fat (lean) protein, and whole grains. Limit unhealthy fats. Eat more healthy fats such as avocados, eggs, and oily fish. Work with your health care provider to treat any other conditions that you may have, such as obesity, high blood pressure, or diabetes. Do not use any products that contain nicotine or tobacco. These products include cigarettes, chewing tobacco, and vaping devices, such as e-cigarettes. If you need help quitting, ask your health care provider. Do not use illegal drugs, especially stimulants such as cocaine or methamphetamine. General instructions Take over-the-counter and prescription medicines only as told by your health care provider. Talk with your health care provider about how to manage stress. Keep all follow-up visits. This is important. Get help right away if: You develop any symptoms of aortic dissection after treatment, including severe pain in your chest, back, or abdomen. You have pain in your chest. You have weakness in your arm or leg. You have pain in your abdomen. You have trouble breathing or you develop a cough. You faint. You develop a racing heartbeat (palpitations). These symptoms may be an emergency. Get help right away. Call 911. Do not wait to see if the symptoms will go away. Do not drive yourself to the hospital. Summary Aortic dissection happens when there is a tear in the wall of the body's main blood vessel (aorta). It is a medical emergency. The most common symptom is severe pain in the chest or pain that spreads (radiates) to  the back, neck, jaw, or abdomen. It is important to treat aortic dissection as quickly as possible. Treatment includes medicines and surgery. Take over-the-counter and prescription medicines only as told by your health care provider. This information is not intended to replace advice given to you by your health  care provider. Make sure you discuss any questions you have with your health care provider. Document Revised: 08/11/2021 Document Reviewed: 02/16/2021 Elsevier Patient Education  2023 [employer].

## 2023-04-04 ENCOUNTER — Encounter (INDEPENDENT_AMBULATORY_CARE_PROVIDER_SITE_OTHER): Payer: Self-pay | Admitting: *Deleted

## 2023-04-25 ENCOUNTER — Telehealth: Payer: Self-pay | Admitting: *Deleted

## 2023-04-25 NOTE — Telephone Encounter (Signed)
  Procedure: Colonoscopy  Height: 5'10  Weight: 235lbs        Have you had a colonoscopy before?  03/2018 Dr. Shaaron  Do you have family history of colon cancer?  Yes father  Do you have a family history of polyps? yes  Previous colonoscopy with polyps removed? yes  Do you have a history colorectal cancer?   no  Are you diabetic?  no  Do you have a prosthetic or mechanical heart valve? no  Do you have a pacemaker/defibrillator?   no  Have you had endocarditis/atrial fibrillation?  no  Do you use supplemental oxygen/CPAP?  no  Have you had joint replacement within the last 12 months?  no  Do you tend to be constipated or have to use laxatives?  no   Do you have history of alcohol use? If yes, how much and how often.  no  Do you have history or are you using drugs? If yes, what do are you  using?  no  Have you ever had a stroke/heart attack?  no  Have you ever had a heart or other vascular stent placed,?no  Do you take weight loss medication? no  Do you take any blood-thinning medications such as: (Plavix, aspirin, Coumadin, Aggrenox, Brilinta, Xarelto, Eliquis, Pradaxa, Savaysa or Effient)? Aspirin 81mg   If yes we need the name, milligram, dosage and who is prescribing doctor:               Current Outpatient Medications  Medication Sig Dispense Refill   rosuvastatin (CRESTOR) 10 MG tablet Take 10 mg by mouth daily.     ASPIRIN 81 PO      glucosamine-chondroitin 500-400 MG tablet Take 2 tablets by mouth daily.      No current facility-administered medications for this visit.    Allergies  Allergen Reactions   Ibuprofen Anaphylaxis, Itching and Rash   Penicillins Itching and Other (See Comments)    Has patient had a PCN reaction causing immediate rash, facial/tongue/throat swelling, SOB or lightheadedness with hypotension: No Has patient had a PCN reaction causing severe rash involving mucus membranes or skin necrosis: No Has patient had a PCN reaction that  required hospitalization: No Has patient had a PCN reaction occurring within the last 10 years: No If all of the above answers are NO, then may proceed with Cephalosporin use.

## 2023-05-15 MED ORDER — NA SULFATE-K SULFATE-MG SULF 17.5-3.13-1.6 GM/177ML PO SOLN: 1.0000 | ORAL | 0 refills | Status: DC

## 2023-05-15 NOTE — Telephone Encounter (Signed)
Spoke with pt. Scheduled with Dr. Jena Gauss 2/27 at9am. Aware will send instructions and rx for prep to pharmacy

## 2023-05-15 NOTE — Addendum Note (Signed)
Addended by: Armstead Peaks on: 05/15/2023 11:43 AM   Modules accepted: Orders

## 2023-05-16 NOTE — Telephone Encounter (Signed)
Questionnaire from recall, no referral needed

## 2023-05-18 ENCOUNTER — Encounter: Payer: Self-pay | Admitting: *Deleted

## 2023-05-18 NOTE — Telephone Encounter (Signed)
Pt needed to reschedule his procedure from 06/15/23 until 06/22/23 due to work schedule. Updated instructions mailed.

## 2023-06-22 ENCOUNTER — Ambulatory Visit (HOSPITAL_COMMUNITY)
Admission: RE | Admit: 2023-06-22 | Discharge: 2023-06-22 | Disposition: A | Discharge status: Home / Self Care | Payer: Medicare Other | Source: Ambulatory Visit | Attending: Internal Medicine | Admitting: Internal Medicine

## 2023-06-22 ENCOUNTER — Other Ambulatory Visit: Payer: Self-pay

## 2023-06-22 ENCOUNTER — Encounter (HOSPITAL_COMMUNITY)
Admission: RE | Disposition: A | Discharge status: Home / Self Care | Payer: Self-pay | Source: Ambulatory Visit | Attending: Internal Medicine

## 2023-06-22 ENCOUNTER — Encounter (HOSPITAL_COMMUNITY): Payer: Self-pay | Admitting: Internal Medicine

## 2023-06-22 ENCOUNTER — Ambulatory Visit (HOSPITAL_BASED_OUTPATIENT_CLINIC_OR_DEPARTMENT_OTHER): Admitting: Certified Registered"

## 2023-06-22 ENCOUNTER — Telehealth: Payer: Self-pay | Admitting: *Deleted

## 2023-06-22 ENCOUNTER — Ambulatory Visit (HOSPITAL_COMMUNITY): Admitting: Certified Registered"

## 2023-06-22 DIAGNOSIS — Z1211 Encounter for screening for malignant neoplasm of colon: Secondary | ICD-10-CM

## 2023-06-22 DIAGNOSIS — I7121 Aneurysm of the ascending aorta, without rupture: Secondary | ICD-10-CM | POA: Insufficient documentation

## 2023-06-22 DIAGNOSIS — K5730 Diverticulosis of large intestine without perforation or abscess without bleeding: Secondary | ICD-10-CM | POA: Insufficient documentation

## 2023-06-22 DIAGNOSIS — Z800 Family history of malignant neoplasm of digestive organs: Secondary | ICD-10-CM | POA: Diagnosis not present

## 2023-06-22 HISTORY — PX: COLONOSCOPY WITH PROPOFOL: SHX5780

## 2023-06-22 SURGERY — COLONOSCOPY WITH PROPOFOL
Anesthesia: General

## 2023-06-22 MED ORDER — PROPOFOL 500 MG/50ML IV EMUL
INTRAVENOUS | Status: DC | PRN
  Administered 2023-06-22: 150 ug/kg/min via INTRAVENOUS

## 2023-06-22 MED ORDER — LACTATED RINGERS IV SOLN: INTRAVENOUS | Status: DC

## 2023-06-22 MED ORDER — LACTATED RINGERS IV SOLN: INTRAVENOUS | Status: DC | PRN

## 2023-06-22 MED ORDER — LIDOCAINE HCL (PF) 2 % IJ SOLN
INTRAMUSCULAR | Status: DC | PRN
  Administered 2023-06-22: 100 mg via INTRADERMAL

## 2023-06-22 MED ORDER — PROPOFOL 10 MG/ML IV BOLUS
INTRAVENOUS | Status: DC | PRN
Start: 2023-06-22 — End: 2023-06-22
  Administered 2023-06-22: 100 mg via INTRAVENOUS
  Administered 2023-06-22: 50 mg via INTRAVENOUS

## 2023-06-22 NOTE — Anesthesia Procedure Notes (Signed)
 Date/Time: 06/22/2023 9:11 AM  Performed by: Julian Reil, CRNAPre-anesthesia Checklist: Patient identified, Emergency Drugs available, Suction available and Patient being monitored Patient Re-evaluated:Patient Re-evaluated prior to induction Oxygen Delivery Method: Nasal cannula Induction Type: IV induction Placement Confirmation: positive ETCO2

## 2023-06-22 NOTE — Anesthesia Postprocedure Evaluation (Signed)
 Anesthesia Post Note  Patient: Chris Ramirez  Procedure(s) Performed: COLONOSCOPY WITH PROPOFOL  Patient location during evaluation: Phase II Anesthesia Type: General Level of consciousness: awake Pain management: pain level controlled Vital Signs Assessment: post-procedure vital signs reviewed and stable Respiratory status: spontaneous breathing and respiratory function stable Cardiovascular status: blood pressure returned to baseline and stable Postop Assessment: no headache and no apparent nausea or vomiting Anesthetic complications: no Comments: Late entry   No notable events documented.   Last Vitals:  Vitals:   06/22/23 0936 06/22/23 0941  BP: 94/61 93/63  Pulse: 79 78  Resp: 15 18  Temp: 36.5 C   SpO2: 92% 92%    Last Pain:  Vitals:   06/22/23 0936  TempSrc: Oral  PainSc: 0-No pain                 Windell Norfolk

## 2023-06-22 NOTE — Op Note (Signed)
 [facility] Patient Name: Chris Ramirez Procedure Date: 06/22/2023 8:53 AM MRN: [id number] Date of Birth: 03-May-1950 Attending MD: Gennette Pac , MD, [id number] CSN: [id number] Age: 73 Admit Type: Outpatient Procedure:                Colonoscopy Indications:              Screening in patient at increased risk: Family                            history of 1st-degree relative with colorectal                            cancer Providers:                Gennette Pac, MD, Francoise Ceo RN, RN,                            Elinor Parkinson Referring MD:              Medicines:                Propofol per Anesthesia Complications:            No immediate complications. Estimated Blood Loss:     Estimated blood loss: none. Procedure:                Pre-Anesthesia Assessment:                           - Prior to the procedure, a History and Physical                            was performed, and patient medications and                            allergies were reviewed. The patient's tolerance of                            previous anesthesia was also reviewed. The risks                            and benefits of the procedure and the sedation                            options and risks were discussed with the patient.                            All questions were answered, and informed consent                            was obtained. Prior Anticoagulants: The patient has                            taken no anticoagulant or antiplatelet agents. ASA                            Grade Assessment:  III - A patient with severe                            systemic disease. After reviewing the risks and                            benefits, the patient was deemed in satisfactory                            condition to undergo the procedure.                           After obtaining informed consent, the colonoscope                            was passed under direct vision. Throughout the                             procedure, the patient's blood pressure, pulse, and                            oxygen saturations were monitored continuously. The                            [id number]) scope was introduced through the                            anus and advanced to the the cecum, identified by                            appendiceal orifice and ileocecal valve. The                            colonoscopy was performed without difficulty. The                            patient tolerated the procedure well. The quality                            of the bowel preparation was adequate. The                            ileocecal valve, appendiceal orifice, and rectum                            were photographed. The entire colon was well                            visualized. Scope In: 9:16:34 AM Scope Out: 9:32:32 AM Scope Withdrawal Time: 0 hours 8 minutes 5 seconds  Total Procedure Duration: 0 hours 15 minutes 58 seconds  Findings:      The perianal and digital rectal examinations were normal.      Many large-mouthed and medium-mouthed diverticula were found in the  sigmoid colon and descending colon. Some seeds vegetable matter       throughout his colon which were washed away and suctioned out to gain       adequate visualization of the colonic mucosa.      The exam was otherwise without abnormality on direct and retroflexion       views. Impression:               - Diverticulosis in the sigmoid colon and in the                            descending colon.                           - The examination was otherwise normal on direct                            and retroflexion views.                           - No specimens collected. Moderate Sedation:      Moderate (conscious) sedation was personally administered by an       anesthesia professional. The following parameters were monitored: oxygen       saturation, heart rate, blood pressure, respiratory rate, EKG,  adequacy       of pulmonary ventilation, and response to care. Recommendation:           - Patient has a contact number available for                            emergencies. The signs and symptoms of potential                            delayed complications were discussed with the                            patient. Return to normal activities tomorrow.                            Written discharge instructions were provided to the                            patient.                           - Advance diet as tolerated.                           - Continue present medications.                           - Repeat colonoscopy in 5 years for screening                            purposes. At next colonoscopy, he needs a full  GoLytely prep.                           - Return to GI office (date not yet determined). Procedure Code(s):        --- Professional ---                           [phone], Colonoscopy, flexible; diagnostic, including                            collection of specimen(s) by brushing or washing,                            when performed (separate procedure) Diagnosis Code(s):        --- Professional ---                           Z80.0, Family history of malignant neoplasm of                            digestive organs                           K57.30, Diverticulosis of large intestine without                            perforation or abscess without bleeding CPT copyright 2022 American Medical Association. All rights reserved. The codes documented in this report are preliminary and upon coder review may  be revised to meet current compliance requirements. Chris Ramirez. Chris Kirshenbaum, MD Gennette Pac, MD 06/22/2023 9:46:48 AM This report has been signed electronically. Number of Addenda: 0

## 2023-06-22 NOTE — Discharge Instructions (Addendum)
  Colonoscopy Discharge Instructions  Read the instructions outlined below and refer to this sheet in the next few weeks. These discharge instructions provide you with general information on caring for yourself after you leave the hospital. Your doctor may also give you specific instructions. While your treatment has been planned according to the most current medical practices available, unavoidable complications occasionally occur. If you have any problems or questions after discharge, call Dr. Jena Gauss at [phone]. ACTIVITY You may resume your regular activity, but move at a slower pace for the next 24 hours.  Take frequent rest periods for the next 24 hours.  Walking will help get rid of the air and reduce the bloated feeling in your belly (abdomen).  No driving for 24 hours (because of the medicine (anesthesia) used during the test).   Do not sign any important legal documents or operate any machinery for 24 hours (because of the anesthesia used during the test).  NUTRITION Drink plenty of fluids.  You may resume your normal diet as instructed by your doctor.  Begin with a light meal and progress to your normal diet. Heavy or fried foods are harder to digest and may make you feel sick to your stomach (nauseated).  Avoid alcoholic beverages for 24 hours or as instructed.  MEDICATIONS You may resume your normal medications unless your doctor tells you otherwise.  WHAT YOU CAN EXPECT TODAY Some feelings of bloating in the abdomen.  Passage of more gas than usual.  Spotting of blood in your stool or on the toilet paper.  IF YOU HAD POLYPS REMOVED DURING THE COLONOSCOPY: No aspirin products for 7 days or as instructed.  No alcohol for 7 days or as instructed.  Eat a soft diet for the next 24 hours.  FINDING OUT THE RESULTS OF YOUR TEST Not all test results are available during your visit. If your test results are not back during the visit, make an appointment with your caregiver to find out the  results. Do not assume everything is normal if you have not heard from your caregiver or the medical facility. It is important for you to follow up on all of your test results.  SEEK IMMEDIATE MEDICAL ATTENTION IF: You have more than a spotting of blood in your stool.  Your belly is swollen (abdominal distention).  You are nauseated or vomiting.  You have a temperature over 101.  You have abdominal pain or discomfort that is severe or gets worse throughout the day.     You have diverticulosis.  No polyps found today  Consider 1 more screening colonoscopy in 5 years if your overall health permits  At patient request, I called Darl Pikes at [phone] findings and recommendations

## 2023-06-22 NOTE — H&P (Signed)
 @LOGO @   Primary Care Physician:  Aliene Beams, MD Primary Gastroenterologist:  Dr. Jena Gauss  Pre-Procedure History & Physical: HPI:  Chris Ramirez is a 72 y.o. male here for  high risk screening colonoscopy.  Father with colon cancer.  Negative colonoscopy  Past Medical History:  Diagnosis Date   Hyperlipidemia    Managed by diet    Past Surgical History:  Procedure Laterality Date   COLONOSCOPY  04/26/06   Anal paplla and 2 diminutive rectal polyps(Hyperplastic) other wise normal   COLONOSCOPY N/A 07/16/2012   Procedure: COLONOSCOPY;  Surgeon: Corbin Ade, MD;  Location: [facility];  Service: Endoscopy;  Laterality: N/A;  8:30-moved to 10:30 [name] to notify pt   COLONOSCOPY N/A 03/30/2018   Procedure: COLONOSCOPY;  Surgeon: Corbin Ade, MD;  Location: [facility];  Service: Endoscopy;  Laterality: N/A;  7:30   INGUINAL HERNIA REPAIR     INGUINAL HERNIA REPAIR Left 01/21/2015   Procedure: LAPAROSCOPIC LEFT INGUINAL HERNIA REPAIR WITH MESH;  Surgeon: Abigail Miyamoto, MD;  Location: [facility];  Service: General;  Laterality: Left;   INSERTION OF MESH Left 01/21/2015   Procedure: INSERTION OF MESH;  Surgeon: Abigail Miyamoto, MD;  Location: [facility];  Service: General;  Laterality: Left;    Prior to Admission medications   Medication Sig Start Date End Date Taking? Authorizing Provider  ASPIRIN 81 PO  08/17/22  Yes [provider]  glucosamine-chondroitin 500-400 MG tablet Take 2 tablets by mouth daily.    Yes [provider]  rosuvastatin (CRESTOR) 10 MG tablet Take 10 mg by mouth daily.   Yes [provider]  Na Sulfate-K Sulfate-Mg Sulfate concentrate 17.5-3.13-1.6 GM/177ML SOLN Take 1 kit by mouth as directed. 05/15/23   Corbin Ade, MD    Allergies as of 05/15/2023 - Review Complete 04/25/2023  Allergen Reaction Noted   Ibuprofen Anaphylaxis, Itching, and Rash 07/05/2012   Penicillins Itching and  Other (See Comments) 07/05/2012    Family History  Problem Relation Age of Onset   Colon cancer Father        in his 33s   Heart attack Father    Heart failure Mother     Social History   Socioeconomic History   Marital status: Married    Spouse name: Not on file   Number of children: Not on file   Years of education: Not on file   Highest education level: Not on file  Occupational History   Occupation: Careers information officer: SELF EMPLOYED   Occupation: HVAC  Tobacco Use   Smoking status: Never   Smokeless tobacco: Never  Vaping Use   Vaping status: Never Used  Substance and Sexual Activity   Alcohol use: Yes    Alcohol/week: 1.0 standard drink of alcohol    Types: 1 Glasses of wine per week   Drug use: No   Sexual activity: Not on file  Other Topics Concern   Not on file  Social History Narrative   IT sales professional, Agricultural consultant, former Stage manager.   Advertising account executive   Social Drivers of Health   Financial Resource Strain: Not on file  Food Insecurity: Not on file  Transportation Needs: Not on file  Physical Activity: Not on file  Stress: Not on file  Social Connections: Not on file  Intimate Partner Violence: Not on file    Review of Systems: See HPI, otherwise negative ROS  Physical Exam: BP (!) 144/87  Pulse 82   Temp 98.3 F (36.8 C) (Oral)   Resp 18   Ht 5\' 10"  (1.778 m)   Wt 104.3 kg   SpO2 93%   BMI 33.00 kg/m  General:   Alert,  Well-developed, well-nourished, pleasant and cooperative in NAD Neck:  Supple; no masses or thyromegaly. No significant cervical adenopathy. Lungs:  Clear throughout to auscultation.   No wheezes, crackles, or rhonchi. No acute distress. Heart:  Regular rate and rhythm; no murmurs, clicks, rubs,  or gallops. Abdomen: Non-distended, normal bowel sounds.  Soft and nontender without appreciable mass or hepatosplenomegaly.  Impression/Plan:   73 year old gentleman here for high risk screening colonoscopy The risks,  benefits, limitations, alternatives and imponderables have been reviewed with the patient. Questions have been answered. All parties are agreeable.       Notice: This dictation was prepared with Dragon dictation along with smaller phrase technology. Any transcriptional errors that result from this process are unintentional and may not be corrected upon review.

## 2023-06-22 NOTE — Telephone Encounter (Signed)
-----   Message from Eula Listen sent at 06/22/2023  9:38 AM EST -----  got his colonoscopy done.  He got small-volume or prep as he reports.  It was marginal.  If he gets another colonoscopy in the future he needs a regular prep.  Please flag the chart.  Thanks

## 2023-06-22 NOTE — Anesthesia Preprocedure Evaluation (Signed)

## 2023-06-22 NOTE — Telephone Encounter (Signed)
 Flagged in chart

## 2023-06-22 NOTE — Transfer of Care (Signed)
 Immediate Anesthesia Transfer of Care Note  Patient: Chris Ramirez  Procedure(s) Performed: COLONOSCOPY WITH PROPOFOL  Patient Location: Endoscopy Unit  Anesthesia Type:General  Level of Consciousness: drowsy  Airway & Oxygen Therapy: Patient Spontanous Breathing  Post-op Assessment: Report given to RN and Post -op Vital signs reviewed and stable  Post vital signs: Reviewed and stable  Last Vitals:  Vitals Value Taken Time  BP    Temp    Pulse    Resp    SpO2      Last Pain:  Vitals:   06/22/23 0911  TempSrc:   PainSc: 0-No pain      Patients Stated Pain Goal: 7 (06/22/23 0744)  Complications: No notable events documented.

## 2023-06-23 ENCOUNTER — Encounter (HOSPITAL_COMMUNITY): Payer: Self-pay | Admitting: Internal Medicine

## 2023-07-31 ENCOUNTER — Other Ambulatory Visit: Payer: Self-pay | Admitting: Surgery

## 2023-07-31 DIAGNOSIS — I7121 Aneurysm of the ascending aorta, without rupture: Secondary | ICD-10-CM

## 2023-08-31 ENCOUNTER — Ambulatory Visit
Admission: RE | Admit: 2023-08-31 | Discharge: 2023-08-31 | Disposition: A | Discharge status: Home / Self Care | Source: Ambulatory Visit | Attending: Surgery | Admitting: Surgery

## 2023-08-31 DIAGNOSIS — I7121 Aneurysm of the ascending aorta, without rupture: Secondary | ICD-10-CM

## 2023-08-31 MED ORDER — IOPAMIDOL (ISOVUE-370) INJECTION 76%
75.0000 mL | Freq: Once | INTRAVENOUS | Status: AC | PRN
Start: 2023-08-31 — End: 2023-08-31
  Administered 2023-08-31: 75 mL via INTRAVENOUS

## 2023-09-04 NOTE — Progress Notes (Unsigned)
 [address]       [place] [address]             [phone]        Chris Ramirez [id number] 1950-06-02  History of Present Illness: Chris Ramirez is a 73 year old with a past medical history of hyperlipidemia and aortic root dilatation. 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 He was referred to our clinic and seen about 1 year ago by my colleague Chris Zimmerman, PA-C.  He denies family history of ATAA and personal history of connective tissue disorder.  Today he reports he has had right hip pain requiring steroid injections and he has an appointment coming up with an orthopedic surgeon. 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 He denies chest pain, chest tightness, shortness of breath, dizziness and LOC.   Current Outpatient Medications on File Prior to Visit  Medication Sig Dispense Refill   ASPIRIN 81 PO      glucosamine-chondroitin 500-400 MG tablet Take 2 tablets by mouth daily.      Na Sulfate-K Sulfate-Mg Sulfate concentrate 17.5-3.13-1.6 GM/177ML SOLN Take 1 kit by mouth as directed. 354 mL 0   rosuvastatin (CRESTOR) 10 MG tablet Take 10 mg by mouth daily.     No current facility-administered medications on file prior to visit.   Vitals: Today's Vitals   09/12/23 1103  BP: (!) 158/76  Pulse: 83  Resp: 18  SpO2: 95%  Weight: 241 lb (109.3 kg)  Height: 5\' 10"  (1.778 m)   Body mass index is 34.58 kg/m.  Physical Exam General: Alert and oriented, no acute distress Neuro: Grossly intact CV: Regular rate and rhythm, no murmur Pulm: Clear to auscultation bilaterally GI: Nontender, no distension Extremities: trace edema BLE, 2+ radial pulses bilaterally  CTA Results: CLINICAL DATA:  Thoracic aortic aneurysm   EXAM: CT ANGIOGRAPHY CHEST WITH CONTRAST   TECHNIQUE: Multidetector CT imaging of the chest was performed  using the standard protocol during bolus administration of intravenous contrast. Multiplanar CT image reconstructions and MIPs were obtained to evaluate the vascular anatomy.   RADIATION DOSE REDUCTION: This exam was performed according to the departmental dose-optimization program which includes automated exposure control, adjustment of the mA and/or kV according to patient size and/or use of iterative reconstruction technique.   CONTRAST:  75mL ISOVUE -370 IOPAMIDOL  (ISOVUE -370) INJECTION 76%   COMPARISON:  07/20/2022   FINDINGS: Cardiovascular: 4 cm ascending thoracic aortic aneurysm, with no evidence of aortic dissection. The aortic arch measures up to 2.7 cm, and the descending thoracic aorta measures 2.5 cm in diameter.   The heart is unremarkable without pericardial effusion. Atherosclerosis of the aorta and coronary vasculature.   Mediastinum/Nodes: No enlarged mediastinal, hilar, or axillary lymph nodes. Thyroid  gland, trachea, and esophagus demonstrate no significant findings.   Lungs/Pleura: No acute airspace disease, effusion, or pneumothorax. Central airways are patent.   Upper Abdomen: No acute abnormality.   Musculoskeletal: No acute or destructive bony abnormalities. Reconstructed images demonstrate no additional findings.   Review of the MIP images confirms the above findings.   IMPRESSION: 1. 4 cm ascending thoracic aortic aneurysm. Recommend annual imaging followup by CTA or MRA. This recommendation follows 2010 ACCF/AHA/AATS/ACR/ASA/SCA/SCAI/SIR/STS/SVM Guidelines for the Diagnosis and Management of Patients with Thoracic Aortic Disease. Circulation. 2010; 121: D664-Q034. Aortic aneurysm NOS (ICD10-I71.9) 2. No acute intrathoracic process. 3. Aortic Atherosclerosis (  ICD10-I70.0). Coronary artery atherosclerosis.     Electronically Signed   By: Chris Ramirez M.D.   On: 08/31/2023 14:47  Impression and Plan: ATAA: [name] presents to the  clinic with a stable 4.0cm ascending aortic aneurysm. Echocardiogram in 2014 shows a tricuspid aortic valve without evidence of regurgitation. We discussed the natural history and and risk factors for growth of ascending aortic aneurysms.  We covered the importance of smoking cessation, tight blood pressure control, refraining from lifting heavy objects, and avoiding fluoroquinolones. The patient is aware of signs and symptoms of aortic dissection and when to present to the emergency department.  We will continue surveillance, plan to have the patient return to clinic with chest CTA in 1 year.  Aortic atherosclerosis and coronary artery atherosclerosis: Continue Rosuvastatin, will follow up with PCP.  HTN: Elevated in clinic today but patient states it is better controlled at home. Monitor blood pressure more often at home, call PCP if remains elevated.   Risk Modification:  Statin:  Rosuvastatin  Smoking cessation instruction/counseling given:  never smoker  Patient was counseled on importance of Blood Pressure Control.  Despite Medical intervention if the patient notices persistently elevated blood pressure readings.  They are instructed to contact their Primary Care Physician  Please avoid use of Fluoroquinolones as this can potentially increase your risk of Aortic Rupture and/or Dissection  Patient educated on signs and symptoms of Aortic Dissection, handout also provided in AVS  Randa Burton, PA-C 09/04/23

## 2023-09-05 NOTE — Patient Instructions (Signed)

## 2023-09-07 ENCOUNTER — Ambulatory Visit

## 2023-09-12 ENCOUNTER — Ambulatory Visit: Attending: Surgery | Admitting: Physician Assistant

## 2023-09-12 ENCOUNTER — Encounter: Payer: Self-pay | Admitting: Physician Assistant

## 2023-09-12 VITALS — BP 158/76 | HR 83 | Resp 18 | Ht 70.0 in | Wt 241.0 lb

## 2023-09-12 DIAGNOSIS — I7121 Aneurysm of the ascending aorta, without rupture: Secondary | ICD-10-CM | POA: Diagnosis not present
